# Patient Record
Sex: Male | Born: 1985 | Race: Black or African American | Hispanic: No | Marital: Married | Smoking: Former smoker
Health system: Southern US, Community
[De-identification: ages and names within clinical notes are randomized; demographics above are authoritative.]

## PROBLEM LIST (undated history)

## (undated) ENCOUNTER — Emergency Department (HOSPITAL_BASED_OUTPATIENT_CLINIC_OR_DEPARTMENT_OTHER): Payer: BC Managed Care – PPO | Source: Home / Self Care

## (undated) DIAGNOSIS — R202 Paresthesia of skin: Secondary | ICD-10-CM

## (undated) DIAGNOSIS — R200 Anesthesia of skin: Secondary | ICD-10-CM

## (undated) DIAGNOSIS — I10 Essential (primary) hypertension: Secondary | ICD-10-CM

## (undated) DIAGNOSIS — R9431 Abnormal electrocardiogram [ECG] [EKG]: Secondary | ICD-10-CM

## (undated) DIAGNOSIS — R51 Headache: Secondary | ICD-10-CM

## (undated) DIAGNOSIS — B009 Herpesviral infection, unspecified: Secondary | ICD-10-CM

## (undated) HISTORY — DX: Abnormal electrocardiogram (ECG) (EKG): R94.31

## (undated) HISTORY — DX: Headache: R51

## (undated) HISTORY — DX: Paresthesia of skin: R20.2

## (undated) HISTORY — DX: Anesthesia of skin: R20.0

---

## 2014-04-12 ENCOUNTER — Encounter (HOSPITAL_BASED_OUTPATIENT_CLINIC_OR_DEPARTMENT_OTHER): Payer: Self-pay | Admitting: Emergency Medicine

## 2014-04-12 ENCOUNTER — Emergency Department (HOSPITAL_BASED_OUTPATIENT_CLINIC_OR_DEPARTMENT_OTHER)
Admission: EM | Admit: 2014-04-12 | Discharge: 2014-04-13 | Disposition: A | Discharge status: Home / Self Care | Payer: BC Managed Care – PPO | Attending: Emergency Medicine | Admitting: Emergency Medicine

## 2014-04-12 DIAGNOSIS — B353 Tinea pedis: Secondary | ICD-10-CM

## 2014-04-12 DIAGNOSIS — Z87891 Personal history of nicotine dependence: Secondary | ICD-10-CM | POA: Insufficient documentation

## 2014-04-12 DIAGNOSIS — L03115 Cellulitis of right lower limb: Secondary | ICD-10-CM

## 2014-04-12 DIAGNOSIS — L02619 Cutaneous abscess of unspecified foot: Secondary | ICD-10-CM | POA: Insufficient documentation

## 2014-04-12 DIAGNOSIS — L03119 Cellulitis of unspecified part of limb: Principal | ICD-10-CM

## 2014-04-12 NOTE — ED Notes (Signed)
Right foot pain and some swelling around 5th toe.  Skin broken between toes.

## 2014-04-12 NOTE — ED Notes (Signed)
Rt foot pain w broken draining area between 4th toe and little toe onset yesterday   Denies inj

## 2014-04-13 MED ORDER — CEPHALEXIN 250 MG PO CAPS
1000.0000 mg | ORAL_CAPSULE | Freq: Once | ORAL | Status: AC
  Administered 2014-04-13: 1000 mg via ORAL
  Filled 2014-04-13: qty 4

## 2014-04-13 MED ORDER — CEPHALEXIN 500 MG PO CAPS: 500.0000 mg | ORAL_CAPSULE | Freq: Four times a day (QID) | ORAL | Status: DC

## 2014-04-13 MED ORDER — TERBINAFINE HCL 1 % EX CREA: 1.0000 "application " | TOPICAL_CREAM | Freq: Two times a day (BID) | CUTANEOUS | Status: DC

## 2014-04-13 NOTE — Discharge Instructions (Signed)
Athlete's Foot Athlete's foot (tinea pedis) is a fungal infection of the skin on the feet. It often occurs on the skin between the toes or underneath the toes. It can also occur on the soles of the feet. Athlete's foot is more likely to occur in hot, humid weather. Not washing your feet or changing your socks often enough can contribute to athlete's foot. The infection can spread from person to person (contagious). CAUSES Athlete's foot is caused by a fungus. This fungus thrives in warm, moist places. Most people get athlete's foot by sharing shower stalls, towels, and wet floors with an infected person. People with weakened immune systems, including those with diabetes, may be more likely to get athlete's foot. SYMPTOMS   Itchy areas between the toes or on the soles of the feet.  White, flaky, or scaly areas between the toes or on the soles of the feet.  Tiny, intensely itchy blisters between the toes or on the soles of the feet.  Tiny cuts on the skin. These cuts can develop a bacterial infection.  Thick or discolored toenails. DIAGNOSIS  Your caregiver can usually tell what the problem is by doing a physical exam. Your caregiver may also take a skin sample from the rash area. The skin sample may be examined under a microscope, or it may be tested to see if fungus will grow in the sample. A sample may also be taken from your toenail for testing. TREATMENT  Over-the-counter and prescription medicines can be used to kill the fungus. These medicines are available as powders or creams. Your caregiver can suggest medicines for you. Fungal infections respond slowly to treatment. You may need to continue using your medicine for several weeks. PREVENTION   Do not share towels.  Wear sandals in wet areas, such as shared locker rooms and shared showers.  Keep your feet dry. Wear shoes that allow air to circulate. Wear cotton or wool socks. HOME CARE INSTRUCTIONS   Take medicines as directed by  your caregiver. Do not use steroid creams on athlete's foot.  Keep your feet clean and cool. Wash your feet daily and dry them thoroughly, especially between your toes.  Change your socks every day. Wear cotton or wool socks. In hot climates, you may need to change your socks 2 to 3 times per day.  Wear sandals or canvas tennis shoes with good air circulation.  If you have blisters, soak your feet in Burow's solution or Epsom salts for 20 to 30 minutes, 2 times a day to dry out the blisters. Make sure you dry your feet thoroughly afterward. SEEK MEDICAL CARE IF:   You have a fever.  You have swelling, soreness, warmth, or redness in your foot.  You are not getting better after 7 days of treatment.  You are not completely cured after 30 days.  You have any problems caused by your medicines. MAKE SURE YOU:   Understand these instructions.  Will watch your condition.  Will get help right away if you are not doing well or get worse. Document Released: 11/12/2000 Document Revised: 02/07/2012 Document Reviewed: 09/03/2011 [employer] Patient Information 2014 [employer].  

## 2014-04-13 NOTE — ED Provider Notes (Addendum)
CSN: [id number]     Arrival date & time 04/12/14  2226 History   First MD Initiated Contact with Patient 04/13/14 0045     Chief Complaint  Patient presents with  . Foot Swelling     (Consider location/radiation/quality/duration/timing/severity/associated sxs/prior Treatment) HPI This is a 28 year old male with a two-day history of a blister in the space between his right fourth and fifth toes broke down. The space between the 2 toes is now tender and draining. He also has pain and mild erythema over the dorsal aspect of the right foot proximal to the toes. There is moderate pain on palpation or movement. He denies any systemic symptoms such as fever or chills. He has not been placing any medications on those toes.  History reviewed. No pertinent past medical history. History reviewed. No pertinent past surgical history. No family history on file. History  Substance Use Topics  . Smoking status: Former Games developermoker  . Smokeless tobacco: Not on file  . Alcohol Use: No    Review of Systems  All other systems reviewed and are negative.  Allergies  Review of patient's allergies indicates no known allergies.  Home Medications   Prior to Admission medications   Not on File   BP 147/90  Pulse 79  Temp(Src) 98.6 F (37 C) (Oral)  Resp 16  Ht 5\' 11"  (1.803 m)  Wt 270 lb (122.471 kg)  BMI 37.67 kg/m2  SpO2 97%  Physical Exam General: Well-developed, well-nourished male in no acute distress; appearance consistent with age of record HENT: normocephalic; atraumatic Eyes: pupils equal, round and reactive to light; extraocular muscles intact Neck: supple Heart: regular rate and rhythm Lungs: clear to auscultation bilaterally Abdomen: soft; nondistended; nontender; no masses or hepatosplenomegaly; bowel sounds present Extremities: No deformity; full range of motion; pulses normal; maceration and weeping of the skin between the right fourth and fifth toes with tenderness and mild  erythema of the dorsum of the right foot proximally Neurologic: Awake, alert and oriented; motor function intact in all extremities and symmetric; no facial droop Skin: Warm and dry; thickened toenails consistent with tinea unguium Psychiatric: Normal mood and affect    ED Course  Procedures (including critical care time)   MDM  Suspect tinea pedis with secondary bacterial infection.    Hanley SeamenJohn L Gwendolin Briel, MD 04/13/14 [id number]  Hanley SeamenJohn L Majesty Oehlert, MD 04/13/14 [phone]

## 2016-05-20 ENCOUNTER — Other Ambulatory Visit: Payer: Self-pay

## 2016-05-20 ENCOUNTER — Emergency Department (HOSPITAL_BASED_OUTPATIENT_CLINIC_OR_DEPARTMENT_OTHER)
Admission: EM | Admit: 2016-05-20 | Discharge: 2016-05-21 | Disposition: A | Discharge status: Home / Self Care | Payer: BLUE CROSS/BLUE SHIELD | Attending: Emergency Medicine | Admitting: Emergency Medicine

## 2016-05-20 ENCOUNTER — Encounter (HOSPITAL_BASED_OUTPATIENT_CLINIC_OR_DEPARTMENT_OTHER): Payer: Self-pay

## 2016-05-20 DIAGNOSIS — R202 Paresthesia of skin: Secondary | ICD-10-CM | POA: Insufficient documentation

## 2016-05-20 DIAGNOSIS — R200 Anesthesia of skin: Secondary | ICD-10-CM | POA: Diagnosis not present

## 2016-05-20 DIAGNOSIS — Z87891 Personal history of nicotine dependence: Secondary | ICD-10-CM | POA: Insufficient documentation

## 2016-05-20 DIAGNOSIS — R51 Headache: Secondary | ICD-10-CM | POA: Diagnosis present

## 2016-05-20 DIAGNOSIS — R9431 Abnormal electrocardiogram [ECG] [EKG]: Secondary | ICD-10-CM | POA: Insufficient documentation

## 2016-05-20 DIAGNOSIS — R519 Headache, unspecified: Secondary | ICD-10-CM

## 2016-05-20 LAB — CBC WITH DIFFERENTIAL/PLATELET
Basophils Absolute: 0 10*3/uL (ref 0.0–0.1)
Basophils Relative: 0 %
Eosinophils Absolute: 0.3 10*3/uL (ref 0.0–0.7)
Eosinophils Relative: 4 %
HEMATOCRIT: 42.3 % (ref 39.0–52.0)
Hemoglobin: 14.6 g/dL (ref 13.0–17.0)
LYMPHS PCT: 39 %
Lymphs Abs: 2.8 10*3/uL (ref 0.7–4.0)
MCH: 29 pg (ref 26.0–34.0)
MCHC: 34.5 g/dL (ref 30.0–36.0)
MCV: 83.9 fL (ref 78.0–100.0)
MONOS PCT: 8 %
Monocytes Absolute: 0.6 10*3/uL (ref 0.1–1.0)
NEUTROS ABS: 3.5 10*3/uL (ref 1.7–7.7)
Neutrophils Relative %: 49 %
Platelets: 311 10*3/uL (ref 150–400)
RBC: 5.04 MIL/uL (ref 4.22–5.81)
RDW: 12.6 % (ref 11.5–15.5)
WBC: 7.2 10*3/uL (ref 4.0–10.5)

## 2016-05-20 LAB — BASIC METABOLIC PANEL
ANION GAP: 10 (ref 5–15)
BUN: 12 mg/dL (ref 6–20)
CALCIUM: 9.5 mg/dL (ref 8.9–10.3)
CO2: 28 mmol/L (ref 22–32)
Chloride: 100 mmol/L — ABNORMAL LOW (ref 101–111)
Creatinine, Ser: 1.04 mg/dL (ref 0.61–1.24)
GFR calc Af Amer: >60 mL/min (ref >60)
GFR calc non Af Amer: >60 mL/min (ref >60)
GLUCOSE: 92 mg/dL (ref 65–99)
POTASSIUM: 3.7 mmol/L (ref 3.5–5.1)
Sodium: 138 mmol/L (ref 135–145)

## 2016-05-20 NOTE — ED Notes (Signed)
Pt wants to be "checked out."  He states he has had a headache for the last several days and that his blood pressure has been in the 150's.  Pt has not scheduled an appointment with a PCP.  He would like to know if he has high blood pressure.

## 2016-05-20 NOTE — ED Notes (Signed)
As nurse was leaving room, pt stated that he was having left arm numbness while at work earlier [employer].  No numbness or tingling now.

## 2016-05-20 NOTE — ED Provider Notes (Signed)
CSN: [id number]     Arrival date & time 05/20/16  2251 History  By signing my name below, I, Jasmyn B. Alexander, attest that this documentation has been prepared under the direction and in the presence of Paula LibraJohn Ibrahim Mcpheeters, MD.  Electronically Signed: Gillis EndsJasmyn B. Lyn HollingsheadAlexander, ED Scribe. 05/20/2016. 11:09 PM.   Chief Complaint  Patient presents with  . Headache    The history is provided by the patient. No language interpreter was used.   HPI Comments: Nathaniel Wilkerson is a 30 y.o. male who presents to the Emergency Department complaining of gradual onset, constant, 8/10, frontal and right occipital headache x 3 days. Pt also reports having numbness of the left deltoid region that began about 5PM. He reports that his blood pressure has been in the 150s systolic when tested at work early this week. It was noted to be 138/97 on arrival here, 145/82 on recheck. He has had associated intermittent heart palpitations (rapid heartbeat) that last a couple minutes and then resolve. He has taken 2 aspirin with partial relief of headache. Denies any pain, SOB or focal weakness.  History reviewed. No pertinent past medical history. History reviewed. No pertinent past surgical history. No family history on file. Social History  Substance Use Topics  . Smoking status: Former Games developermoker  . Smokeless tobacco: None  . Alcohol Use: No    Review of Systems  All other systems reviewed and are negative.   Allergies  Review of patient's allergies indicates no known allergies.  Home Medications   Prior to Admission medications   Medication Sig Start Date End Date Taking? Authorizing Provider  cephALEXin (KEFLEX) 500 MG capsule Take 1 capsule (500 mg total) by mouth 4 (four) times daily. 04/13/14   Iza Preston, MD  terbinafine (LAMISIL AT) 1 % cream Apply 1 application topically 2 (two) times daily. 04/13/14   Raife Lizer, MD   BP 145/82 mmHg  Pulse 72  Temp(Src) 98.3 F (36.8 C) (Oral)  Resp 17  Ht 5\' 11"  (1.803 m)   Wt 267 lb (121.11 kg)  BMI 37.26 kg/m2  SpO2 99%   Physical Exam  General: Well-developed, well-nourished male in no acute distress; appearance consistent with age of record HENT: normocephalic; atraumatic Eyes: pupils equal, round and reactive to light; extraocular muscles intact Neck: supple; no change in paresthesias with range of motion Heart: regular rate and rhythm Lungs: clear to auscultation bilaterally Abdomen: soft; nondistended; nontender; no masses or hepatosplenomegaly; bowel sounds present Extremities: No deformity; full range of motion; pulses normal Neurologic: Awake, alert and oriented; motor function intact in all extremities and symmetric; no facial droop; sensation intact bilaterally with subjective alteration of the left deltoid region; normal coordination and speech; negative Romberg; normal finger to nose Skin: Warm and dry Psychiatric: Normal mood and affect   ED Course  Procedures (including critical care time)   MDM   Nursing notes and vitals signs, including pulse oximetry, reviewed.  Summary of this visit's results, reviewed by myself:   EKG Interpretation  Date/Time:  Thursday May 20 2016 23:14:59 EDT Ventricular Rate:  69 PR Interval:    QRS Duration: 101 QT Interval:  383 QTC Calculation: 411 R Axis:   -93 Text Interpretation:  Sinus rhythm Borderline prolonged PR interval Consider left atrial enlargement Left anterior fascicular block Borderline T abnormalities, inferior leads No previous ECGs available Confirmed by Read DriversMOLPUS  MD, Jonny RuizJOHN ([id number]) on 05/20/2016 11:25:38 PM       Labs:  Results for orders placed or performed  during the hospital encounter of 05/20/16 (from the past 24 hour(s))  Basic metabolic panel     Status: Abnormal   Collection Time: 05/20/16 11:15 PM  Result Value Ref Range   Sodium 138 135 - 145 mmol/L   Potassium 3.7 3.5 - 5.1 mmol/L   Chloride 100 (L) 101 - 111 mmol/L   CO2 28 22 - 32 mmol/L   Glucose, Bld 92 65 -  99 mg/dL   BUN 12 6 - 20 mg/dL   Creatinine, Ser 1.611.04 0.61 - 1.24 mg/dL   Calcium 9.5 8.9 - 09.610.3 mg/dL   GFR calc non Af Amer >60 >60 mL/min   GFR calc Af Amer >60 >60 mL/min   Anion gap 10 5 - 15  CBC with Differential/Platelet     Status: None   Collection Time: 05/20/16 11:15 PM  Result Value Ref Range   WBC 7.2 4.0 - 10.5 K/uL   RBC 5.04 4.22 - 5.81 MIL/uL   Hemoglobin 14.6 13.0 - 17.0 g/dL   HCT 04.542.3 40.939.0 - 81.152.0 %   MCV 83.9 78.0 - 100.0 fL   MCH 29.0 26.0 - 34.0 pg   MCHC 34.5 30.0 - 36.0 g/dL   RDW 91.412.6 78.211.5 - 95.615.5 %   Platelets 311 150 - 400 K/uL   Neutrophils Relative % 49 %   Neutro Abs 3.5 1.7 - 7.7 K/uL   Lymphocytes Relative 39 %   Lymphs Abs 2.8 0.7 - 4.0 K/uL   Monocytes Relative 8 %   Monocytes Absolute 0.6 0.1 - 1.0 K/uL   Eosinophils Relative 4 %   Eosinophils Absolute 0.3 0.0 - 0.7 K/uL   Basophils Relative 0 %   Basophils Absolute 0.0 0.0 - 0.1 K/uL   12:00 AM Patient advised of EKG findings and normal laboratory studies. His symptoms are not consistent with an acute CVA. Given his abnormal EKG and borderline hypertension we will have him follow-up with cardiology.  Final diagnoses:  Bad headache  Nonspecific abnormal electrocardiogram (ECG) (EKG)  Numbness and tingling in left arm   I personally performed the services described in this documentation, which was scribed in my presence. The recorded information has been reviewed and is accurate.   Paula LibraJohn Kaelyn Innocent, MD 05/21/16 0003

## 2016-05-21 MED ORDER — NAPROXEN 250 MG PO TABS
500.0000 mg | ORAL_TABLET | Freq: Once | ORAL | Status: AC
  Administered 2016-05-21: 500 mg via ORAL
  Filled 2016-05-21: qty 2

## 2016-06-30 ENCOUNTER — Ambulatory Visit (INDEPENDENT_AMBULATORY_CARE_PROVIDER_SITE_OTHER): Payer: BLUE CROSS/BLUE SHIELD | Admitting: Cardiology

## 2016-06-30 VITALS — BP 146/96 | HR 77 | Ht 71.0 in | Wt 290.8 lb

## 2016-06-30 DIAGNOSIS — G4710 Hypersomnia, unspecified: Secondary | ICD-10-CM | POA: Diagnosis not present

## 2016-06-30 DIAGNOSIS — R002 Palpitations: Secondary | ICD-10-CM

## 2016-06-30 DIAGNOSIS — R519 Headache, unspecified: Secondary | ICD-10-CM | POA: Insufficient documentation

## 2016-06-30 DIAGNOSIS — R9431 Abnormal electrocardiogram [ECG] [EKG]: Secondary | ICD-10-CM

## 2016-06-30 DIAGNOSIS — R0683 Snoring: Secondary | ICD-10-CM | POA: Diagnosis not present

## 2016-06-30 DIAGNOSIS — R202 Paresthesia of skin: Secondary | ICD-10-CM

## 2016-06-30 DIAGNOSIS — R200 Anesthesia of skin: Secondary | ICD-10-CM

## 2016-06-30 DIAGNOSIS — R51 Headache: Secondary | ICD-10-CM

## 2016-06-30 DIAGNOSIS — R400 Somnolence: Secondary | ICD-10-CM

## 2016-06-30 HISTORY — DX: Abnormal electrocardiogram (ECG) (EKG): R94.31

## 2016-06-30 HISTORY — DX: Headache, unspecified: R51.9

## 2016-06-30 HISTORY — DX: Paresthesia of skin: R20.0

## 2016-06-30 NOTE — Progress Notes (Signed)
Electrophysiology Office Note   Date:  06/30/2016   ID:  Kathryn Hubanks, DOB Jul 26, 1986, MRN [id number]  PCP:  No PCP Per Patient  Cardiologist:  Demar Shad Jorja Loa, MD    Chief Complaint  Patient presents with  . New Patient (Initial Visit)    abnormal EKG     History of Present Illness: Nathaniel Wilkerson is a 30 y.o. male who presents today for electrophysiology evaluation.   He presented to the emergency room on 05/20/16 with worsening headaches. He also noted intermittent palpitations for a couple minutes which have resolved on their own. He was noted to have borderline hypertension and abnormal EKG with significant left axis deviation. Since his ER visit, he has not had any further palpitations. He says that he has changed his diet was feels like has helped. He does say that he snores at night, and people told him that he does stop breathing when he sleeps.   Today, he denies symptoms of palpitations, chest pain, shortness of breath, orthopnea, PND, lower extremity edema, claudication, dizziness, presyncope, syncope, bleeding, or neurologic sequela. The patient is tolerating medications without difficulties and is otherwise without complaint today.    Past Medical History:  Diagnosis Date  . Abnormal EKG 06/30/2016  . Numbness and tingling in left arm 06/30/2016  . Occipital headache 06/30/2016   History reviewed. No pertinent surgical history.   Current Outpatient Prescriptions  Medication Sig Dispense Refill  . acetaminophen (TYLENOL) 325 MG tablet Take 650 mg by mouth every 6 (six) hours as needed (pain).     No current facility-administered medications for this visit.     Allergies:   Review of patient's allergies indicates no known allergies.   Social History:  The patient  reports that he has quit smoking. He has never used smokeless tobacco. He reports that he does not drink alcohol or use drugs.   Family History:  The patient's family history includes Diabetes in his  maternal grandmother and paternal grandfather; Heart attack in his maternal grandfather and paternal grandfather; Hypertension in his father, maternal grandfather, maternal grandmother, mother, and paternal grandmother; Kidney cancer in his paternal grandfather.    ROS:  Please see the history of present illness.   Otherwise, review of systems is positive for snoring, cough.   All other systems are reviewed and negative.    PHYSICAL EXAM: VS:  BP (!) 146/96   Pulse 77   Ht 5\' 11"  (1.803 m)   Wt 290 lb 12.8 oz (131.9 kg)   BMI 40.56 kg/m  , BMI Body mass index is 40.56 kg/m. GEN: Well nourished, well developed, in no acute distress  HEENT: normal  Neck: no JVD, carotid bruits, or masses Cardiac: RRR; no murmurs, rubs, or gallops,no edema  Respiratory:  clear to auscultation bilaterally, normal work of breathing GI: soft, nontender, nondistended, + BS MS: no deformity or atrophy  Skin: warm and dry Neuro:  Strength and sensation are intact Psych: euthymic mood, full affect  EKG:  EKG is ordered today. Personal review of the ekg ordered shows sinus rhythm, rate 77, LAFB, R axis -78, nonspecific TWI  Recent Labs: 05/20/2016: BUN 12; Creatinine, Ser 1.04; Hemoglobin 14.6; Platelets 311; Potassium 3.7; Sodium 138    Lipid Panel  No results found for: CHOL, TRIG, HDL, CHOLHDL, VLDL, LDLCALC, LDLDIRECT   Wt Readings from Last 3 Encounters:  06/30/16 290 lb 12.8 oz (131.9 kg)  05/20/16 267 lb (121.1 kg)  04/12/14 270 lb (122.5 kg)  Other studies Reviewed: Additional studies/ records that were reviewed today include: ER notes   ASSESSMENT AND PLAN:  1.  Palpitations: Have not occurred since he changed his diet. We'll continue to monitor.  2. Abnormal ECG: Shows significant left axis deviation with possible LVH. We'll get a transthoracic echo to further evaluate.  3. Hypertension: Blood pressures have been elevated over the last few checks. I have told him that in the  future, it is likely that we Shauntavia Brackin have to treat his elevated blood pressures. He Jaice Lague check his blood pressures at home to see if they are elevated there.  4. Snoring: Does report episodes of apnea. We'll get a sleep study to further evaluate.    Current medicines are reviewed at length with the patient today.   The patient does not have concerns regarding his medicines.  The following changes were made today:  none  Labs/ tests ordered today include:  Orders Placed This Encounter  Procedures  . EKG 12-Lead  . ECHOCARDIOGRAM COMPLETE  . Split night study     Disposition:   FU with Orhan Mayorga 3 months  Signed, Tandi Hanko Jorja Loa, MD  06/30/2016 3:38 PM     [facility] [address] [place] [address] [phone] (office) [phone] (fax)

## 2016-06-30 NOTE — Patient Instructions (Addendum)
Medication Instructions:    Your physician recommends that you continue on your current medications as directed. Please refer to the Current Medication list given to you today.  --- If you need a refill on your cardiac medications before your next appointment, please call your pharmacy. ---  Labwork:  None ordered  Testing/Procedures: Your physician has requested that you have an echocardiogram. Echocardiography is a painless test that uses sound waves to create images of your heart. It provides your doctor with information about the size and shape of your heart and how well your heart's chambers and valves are working. This procedure takes approximately one hour. There are no restrictions for this procedure.  You are scheduled for July 07, 2016 @ 11:30 a.m.  Your physician has recommended that you have a sleep study. This test records several body functions during sleep, including: brain activity, eye movement, oxygen and carbon dioxide blood levels, heart rate and rhythm, breathing rate and rhythm, the flow of air through your mouth and nose, snoring, body muscle movements, and chest and belly movement.  Follow-Up:  Your physician recommends that you schedule a follow-up appointment in: 3 months with Dr. Elberta Fortis  Thank you for choosing [facility]!!   Dory Horn, RN [phone]  Any Other Special Instructions Will Be Listed Below (If Applicable).  Echocardiogram An echocardiogram, or echocardiography, uses sound waves (ultrasound) to produce an image of your heart. The echocardiogram is simple, painless, obtained within a short period of time, and offers valuable information to your health care provider. The images from an echocardiogram can provide information such as:  Evidence of coronary artery disease (CAD).  Heart size.  Heart muscle function.  Heart valve function.  Aneurysm detection.  Evidence of a past heart attack.  Fluid buildup around the  heart.  Heart muscle thickening.  Assess heart valve function. LET [facility] CARE PROVIDER KNOW ABOUT:  Any allergies you have.  All medicines you are taking, including vitamins, herbs, eye drops, creams, and over-the-counter medicines.  Previous problems you or members of your family have had with the use of anesthetics.  Any blood disorders you have.  Previous surgeries you have had.  Medical conditions you have.  Possibility of pregnancy, if this applies. BEFORE THE PROCEDURE  No special preparation is needed. Eat and drink normally.  PROCEDURE   In order to produce an image of your heart, gel will be applied to your chest and a wand-like tool (transducer) will be moved over your chest. The gel will help transmit the sound waves from the transducer. The sound waves will harmlessly bounce off your heart to allow the heart images to be captured in real-time motion. These images will then be recorded.  You may need an IV to receive a medicine that improves the quality of the pictures. AFTER THE PROCEDURE You may return to your normal schedule including diet, activities, and medicines, unless your health care provider tells you otherwise.   This information is not intended to replace advice given to you by your health care provider. Make sure you discuss any questions you have with your health care provider.   Document Released: 11/12/2000 Document Revised: 12/06/2014 Document Reviewed: 07/23/2013 Elsevier Interactive Patient Education 2016 [employer]   Sleep Studies A sleep study (polysomnogram) is a series of tests done while you are sleeping. It can show how well you sleep. This can help your health care provider diagnose a sleep disorder and show how severe your sleep disorder is. A  sleep study may lead to treatment that will help you sleep better and prevent other medical problems caused by poor sleep. If you have a sleep disorder, you may also be at risk for:    Sleep-related accidents.  High blood pressure.  Heart disease.  Stroke.  Other medical conditions. Sleep disorders are common. Your health care provider may suspect a sleep disorder if you:  Have loud snoring most nights.  Have brief periods when you stop breathing at night.  Feel sleepy on most days.  Fall asleep suddenly during the day.  Have trouble falling asleep or staying asleep.  Feel like you need to move your legs when trying to fall asleep.  Have dreams that seem very real shortly after falling asleep.  Feel like you cannot move when you first wake up. WHICH TESTS WILL I NEED TO HAVE?  Most sleep studies last all night and include these tests:  Recordings of your brain activity.  Recordings of your eye movements.  Recording of your heart rate and rhythm.  Blood pressure readings.  Readings of the amount of oxygen in your blood.  Measurements of your chest and belly movement as you breathe during sleep. If you have signs of the sleep disorder called sleep apnea during your test, you may get a mask to wear for the second half of the night.   The mask provides continuous positive airway pressure (CPAP). This may improve sleep apnea significantly.  You will then have all tests done again with the mask in place to see if your measurements and recordings change. HOW ARE SLEEP STUDIES DONE? Most sleep studies are done over one full night of sleep.   You will arrive at the study center in the evening and can go home in the morning.  Bring your pajamas and toothbrush.  Do not have caffeine on the day of your sleep study.  Your health care provider will let you know if you need to stop taking any of your regular medicines before the test. To do the tests included in a polysomnogram, you will have:  Round, sticky patches with sensors attached to recording wires (electrodes) placed on your scalp, face, chest, and limbs.  Wires from all the electrodes and  sensors run from your bed to a computer. The wires can be taken off and put back on if you need to get out of bed to go to the bathroom.  A sensor placed over your nose to measure airflow.  A finger clip put on one finger to measure your blood oxygen level.  A belt around your belly and a belt around your chest to measure breathing movements. WHERE ARE SLEEP STUDIES DONE?  Sleep studies are done at sleep centers. A sleep center may be inside a hospital, office, or clinic.  The room where you have the study may look like a hospital room or a hotel room. The health care providers doing the study may come in and out of the room during the study. Most of the time, they will be in another room monitoring your test.  HOW IS INFORMATION FROM SLEEP STUDIES HELPFUL? A polysomnogram can be used along with your medical history and a physical exam to diagnose conditions, such as:  Sleep apnea.  Restless legs syndrome.  Sleep-related seizure disorders.  Sleep-related movement disorders. A medical doctor who specializes in sleep will evaluate your sleep study. The specialist will share the results with your primary health care provider. Treatments based on your sleep  study may include:  Improving your sleep habits (sleep hygiene).  Wearing a CPAP mask.  Wearing an oral device at night to improve breathing and reduce snoring.  Taking medicine for:  Restless legs syndrome.  Sleep-related seizure disorder.  Sleep-related movement disorder.   This information is not intended to replace advice given to you by your health care provider. Make sure you discuss any questions you have with your health care provider.   Document Released: 05/22/2003 Document Revised: 12/06/2014 Document Reviewed: 01/21/2014 Elsevier Interactive Patient Education [employer]

## 2016-07-07 ENCOUNTER — Ambulatory Visit (HOSPITAL_BASED_OUTPATIENT_CLINIC_OR_DEPARTMENT_OTHER): Admission: RE | Admit: 2016-07-07 | Payer: BLUE CROSS/BLUE SHIELD | Source: Ambulatory Visit

## 2016-07-21 ENCOUNTER — Ambulatory Visit (HOSPITAL_BASED_OUTPATIENT_CLINIC_OR_DEPARTMENT_OTHER)
Admission: RE | Admit: 2016-07-21 | Discharge: 2016-07-21 | Disposition: A | Discharge status: Home / Self Care | Payer: BLUE CROSS/BLUE SHIELD | Source: Ambulatory Visit | Attending: Cardiology | Admitting: Cardiology

## 2016-07-21 DIAGNOSIS — R002 Palpitations: Secondary | ICD-10-CM | POA: Diagnosis present

## 2016-07-21 NOTE — Progress Notes (Signed)
Echocardiogram 2D Echocardiogram has been performed.  Dorothey BasemanReel, Ludia Gartland M 07/21/2016, 2:56 PM

## 2016-07-22 ENCOUNTER — Encounter: Payer: Self-pay | Admitting: Cardiology

## 2016-07-22 NOTE — Telephone Encounter (Signed)
New message ° ° °Pt returning nurse call about test results.  °

## 2016-07-22 NOTE — Telephone Encounter (Signed)
Returned patient call-no answer, LMTCB.

## 2016-07-22 NOTE — Telephone Encounter (Signed)
This encounter was created in error - please disregard.

## 2016-07-28 ENCOUNTER — Ambulatory Visit: Payer: BLUE CROSS/BLUE SHIELD | Admitting: Cardiology

## 2016-08-29 ENCOUNTER — Ambulatory Visit (HOSPITAL_BASED_OUTPATIENT_CLINIC_OR_DEPARTMENT_OTHER): Payer: BLUE CROSS/BLUE SHIELD | Attending: Cardiology | Admitting: Cardiology

## 2016-08-29 VITALS — Ht 72.0 in | Wt 290.0 lb

## 2016-08-29 DIAGNOSIS — G4733 Obstructive sleep apnea (adult) (pediatric): Secondary | ICD-10-CM | POA: Diagnosis not present

## 2016-08-29 DIAGNOSIS — R400 Somnolence: Secondary | ICD-10-CM | POA: Diagnosis present

## 2016-08-29 DIAGNOSIS — R0683 Snoring: Secondary | ICD-10-CM | POA: Insufficient documentation

## 2016-09-06 NOTE — Procedures (Signed)
   Patient Name: Nathaniel Wilkerson, Nathaniel Wilkerson Study Date: 08/29/2016 Gender: Male D.O.B: 01-05-1986 Age (years): 30 Referring Provider: Olga MillersBrian Crenshaw Height (inches): 72 Interpreting Physician: Armanda Magicraci Lesleigh Hughson MD, ABSM Weight (lbs): 290 RPSGT: Armen PickupFord, Evelyn BMI: 39 MRN: [id number] Neck Size: 18.00  CLINICAL INFORMATION Sleep Study Type: NPSG Indication for sleep study: Snoring  SLEEP STUDY TECHNIQUE As per the AASM Manual for the Scoring of Sleep and Associated Events v2.3 (April 2016) with a hypopnea requiring 4% desaturations. The channels recorded and monitored were frontal, central and occipital EEG, electrooculogram (EOG), submentalis EMG (chin), nasal and oral airflow, thoracic and abdominal wall motion, anterior tibialis EMG, snore microphone, electrocardiogram, and pulse oximetry.  MEDICATIONS Patient's medications include: reviewed in the chart. Medications self-administered by patient during sleep study : No sleep medicine administered.  SLEEP ARCHITECTURE The study was initiated at 9:58:31 PM and ended at 3:54:13 AM. Sleep onset time was 14.7 minutes and the sleep efficiency was 27.0%. The total sleep time was 96.0 minutes. Stage REM latency was N/A minutes. The patient spent 5.73% of the night in stage N1 sleep, 86.98% in stage N2 sleep, 7.29% in stage N3 and 0.00% in REM. Alpha intrusion was absent. Supine sleep was 100.00%.  RESPIRATORY PARAMETERS The overall apnea/hypopnea index (AHI) was 18.1 per hour. There were 2 total apneas, including 2 obstructive, 0 central and 0 mixed apneas. There were 27 hypopneas and 44 RERAs. The AHI during Stage REM sleep was N/A per hour. AHI while supine was 18.1 per hour. The mean oxygen saturation was 93.52%. The minimum SpO2 during sleep was 89.00%. Loud snoring was noted during this study.  CARDIAC DATA The 2 lead EKG demonstrated sinus rhythm. The mean heart rate was 76.62 beats per minute. Other EKG findings include: None.  LEG  MOVEMENT DATA The total PLMS were 0 with a resulting PLMS index of 0.00. Associated arousal with leg movement index was 0.0 .  IMPRESSIONS - Moderate obstructive sleep apnea occurred during this study (AHI = 18.1/h). - No significant central sleep apnea occurred during this study (CAI = 0.0/h). - The patient had minimal or no oxygen desaturation during the study (Min O2 = 89.00%) - The patient snored with Loud snoring volume. - No cardiac abnormalities were noted during this study. - Clinically significant periodic limb movements did not occur during sleep. No significant associated arousals.  DIAGNOSIS - Obstructive Sleep Apnea (327.23 [G47.33 ICD-10])  RECOMMENDATIONS - Recommend CPAP titration with a sleep aide as patient had a difficult time sleeping during the study. - Positional therapy avoiding supine position during sleep. - Avoid alcohol, sedatives and other CNS depressants that may worsen sleep apnea and disrupt normal sleep architecture. - Sleep hygiene should be reviewed to assess factors that may improve sleep quality. - Weight management and regular exercise should be initiated or continued if appropriate.   Armanda Magicraci Jacquelina Hewins Diplomate, American Board of Sleep Medicine  ELECTRONICALLY SIGNED ON:  09/06/2016, 9:01 PM [facility] PH: [phone]   FX: [phone] ACCREDITED BY THE AMERICAN ACADEMY OF SLEEP MEDICINE

## 2016-09-07 NOTE — Progress Notes (Signed)
Left message for patient to call back for results.  

## 2016-09-09 NOTE — Progress Notes (Signed)
Spoke with patient at length about sleep apnea and him being in the moderate category.   I explained that the treatment for his apnea would be a CPAP machine.   I also discussed the sleep aide that Dr. Mayford Knifeurner had given approval to call in for his titration study. At this time patient states that he is not sure he wants to proceed.   He is aware of why we feel like this is necessary, and I have explained the next step of titration to him.  Patient stated that he would like to think about this and call me back with a decision.   Patient is aware that he can call my direct line and let me know what he would like to do.   Routed to ordering physician just as an [place].

## 2016-11-26 ENCOUNTER — Emergency Department (HOSPITAL_BASED_OUTPATIENT_CLINIC_OR_DEPARTMENT_OTHER)
Admission: EM | Admit: 2016-11-26 | Discharge: 2016-11-26 | Disposition: A | Discharge status: Home / Self Care | Payer: BLUE CROSS/BLUE SHIELD | Attending: Emergency Medicine | Admitting: Emergency Medicine

## 2016-11-26 ENCOUNTER — Encounter (HOSPITAL_BASED_OUTPATIENT_CLINIC_OR_DEPARTMENT_OTHER): Payer: Self-pay | Admitting: *Deleted

## 2016-11-26 DIAGNOSIS — N509 Disorder of male genital organs, unspecified: Secondary | ICD-10-CM | POA: Diagnosis not present

## 2016-11-26 DIAGNOSIS — Z87891 Personal history of nicotine dependence: Secondary | ICD-10-CM | POA: Diagnosis not present

## 2016-11-26 DIAGNOSIS — Z202 Contact with and (suspected) exposure to infections with a predominantly sexual mode of transmission: Secondary | ICD-10-CM | POA: Diagnosis present

## 2016-11-26 DIAGNOSIS — N489 Disorder of penis, unspecified: Secondary | ICD-10-CM

## 2016-11-26 LAB — URINALYSIS, ROUTINE W REFLEX MICROSCOPIC
Bilirubin Urine: NEGATIVE
Glucose, UA: NEGATIVE mg/dL
Hgb urine dipstick: NEGATIVE
Ketones, ur: NEGATIVE mg/dL
LEUKOCYTES UA: NEGATIVE
Nitrite: NEGATIVE
PROTEIN: NEGATIVE mg/dL
SPECIFIC GRAVITY, URINE: 1.016 (ref 1.005–1.030)
pH: 7.5 (ref 5.0–8.0)

## 2016-11-26 MED ORDER — VALACYCLOVIR HCL 1 G PO TABS: 1000.0000 mg | ORAL_TABLET | Freq: Two times a day (BID) | ORAL | 0 refills | Status: AC

## 2016-11-26 NOTE — ED Provider Notes (Signed)
[facility] Note   CSN: [id number] Arrival date & time: 11/26/16  1802   By signing my name below, I, Clovis PuAvnee Patel, attest that this documentation has been prepared under the direction and in the presence of  RaytheonJosh Solita Macadam PA-C. Electronically Signed: Clovis PuAvnee Patel, ED Scribe. 11/26/16. 8:15 PM.   History   Chief Complaint Chief Complaint  Patient presents with  . Exposure to STD   The history is provided by the patient. No language interpreter was used.   HPI Comments:  Nathaniel Wilkerson is a 30 y.o. male who presents to the Emergency Department complaining of gradual onset, persistent groin itching x several days. Pt also reports dysuria and sores to his groin. He notes he had unprotected intercourse with his partner of 5 years. He states he is unaware if his partner is affected. No alleviating factors noted. Pt denies sore throat, fevers, any other associated symptoms and any other modifying factors at this time.    Past Medical History:  Diagnosis Date  . Abnormal EKG 06/30/2016  . Numbness and tingling in left arm 06/30/2016  . Occipital headache 06/30/2016    Patient Active Problem List   Diagnosis Date Noted  . Occipital headache 06/30/2016  . Numbness and tingling in left arm 06/30/2016  . Abnormal EKG 06/30/2016    History reviewed. No pertinent surgical history.  Home Medications    Prior to Admission medications   Medication Sig Start Date End Date Taking? Authorizing Provider  acetaminophen (TYLENOL) 325 MG tablet Take 650 mg by mouth every 6 (six) hours as needed (pain).    Historical Provider, MD    Family History Family History  Problem Relation Age of Onset  . Hypertension Mother   . Hypertension Father   . Heart attack Maternal Grandfather   . Hypertension Maternal Grandfather   . Heart attack Paternal Grandfather   . Kidney cancer Paternal Grandfather   . Diabetes Paternal Grandfather   . Hypertension Maternal Grandmother   . Diabetes  Maternal Grandmother   . Hypertension Paternal Grandmother     Social History Social History  Substance Use Topics  . Smoking status: Former Games developermoker  . Smokeless tobacco: Never Used  . Alcohol use No     Allergies   Patient has no known allergies.   Review of Systems Review of Systems  Constitutional: Negative for fever.  HENT: Negative for sore throat.   Eyes: Negative for discharge.  Gastrointestinal: Negative for rectal pain.  Genitourinary: Positive for dysuria and genital sores. Negative for discharge, frequency, penile pain and testicular pain.  Musculoskeletal: Negative for arthralgias.  Skin: Negative for rash.  Hematological: Negative for adenopathy.   Physical Exam Updated Vital Signs BP 156/94   Pulse 85   Temp 98.3 F (36.8 C) (Oral)   Resp 20   Ht 6' (1.829 m)   Wt 290 lb (131.5 kg)   SpO2 97%   BMI 39.33 kg/m   Physical Exam  Constitutional: He is oriented to person, place, and time. He appears well-developed and well-nourished. No distress.  HENT:  Head: Normocephalic and atraumatic.  Eyes: Conjunctivae are normal.  Neck: Normal range of motion. Neck supple.  Cardiovascular: Normal rate.   Pulmonary/Chest: Effort normal. No respiratory distress.  Abdominal: He exhibits no distension.  Genitourinary: Testes normal. Circumcised. Penile erythema present. No penile tenderness. No discharge found.     Lymphadenopathy: No inguinal adenopathy noted on the right or left side.  Neurological: He is alert and oriented to  person, place, and time.  Skin: Skin is warm and dry.  Psychiatric: He has a normal mood and affect.  Nursing note and vitals reviewed.  ED Treatments / Results  DIAGNOSTIC STUDIES:  Oxygen Saturation is 97% on RA, normal by my interpretation.    COORDINATION OF CARE:  8:14 PM Will treat with antibiotics. Discussed treatment plan with pt at bedside and pt agreed to plan.  Labs (all labs ordered are listed, but only abnormal  results are displayed) Labs Reviewed  URINALYSIS, ROUTINE W REFLEX MICROSCOPIC  RPR  HIV ANTIBODY (ROUTINE TESTING)  GC/CHLAMYDIA PROBE AMP ([place]    Procedures Procedures (including critical care time)   Initial Impression / Assessment and Plan / ED Course  I have reviewed the triage vital signs and the nursing notes.  Pertinent labs & imaging results that were available during my care of the patient were reviewed by me and considered in my medical decision making (see chart for details).  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 No h/o renal problems. Pt advised on safe sex practices and understands that they have GC/Chlamydia cultures pending and will result in 2-3 days. HIV and RPR sent. Pt encouraged to follow up at local health department for future STI checks. No concern for prostatitis or epididymitis. Discussed return precautions. Pt appears safe for discharge.    Final Clinical Impressions(s) / ED Diagnoses   Final diagnoses:  Penile lesion   Patient with penile lesion, possible genital herpes. Patient tested for gonorrhea, chlamydia, syphilis, and HIV. Will place on course of Valtrex empirically. Follow-up as above.  New Prescriptions Discharge Medication List as of 11/26/2016  8:59 PM    START taking these medications   Details  valACYclovir (VALTREX) 1000 MG tablet Take 1 tablet (1,000 mg total) by mouth 2 (two) times daily., Starting Fri 11/26/2016, Until Fri 12/10/2016, Print      I personally performed the services described in this documentation, which was scribed in my presence. The recorded information has been reviewed and is accurate.     Renne CriglerJoshua Audrinna Sherman, PA-C 11/26/16 2107      Laurence Spatesachel Morgan Little, MD 11/26/16 [phone]

## 2016-11-26 NOTE — ED Triage Notes (Addendum)
Std exposure a week ago. No symptoms.

## 2016-11-26 NOTE — Discharge Instructions (Signed)
Please read and follow all provided instructions.  Your diagnoses today include:  1. Penile lesion     Tests performed today include:  Test for gonorrhea and chlamydia.   Test for HIV and syphilis.   You will be notified by telephone with any positive results.   Vital signs. See below for your results today.   Medications:   Valtrex - medication to help control possible herpes infection  Home care instructions:  Read educational materials contained in this packet and follow any instructions provided.   You should tell your partners about your infection and avoid having sex for one week to allow time for the medicine to work.  Sexually transmitted disease testing also available at:   [facility] [place] [place]  [address], [place], phone [phone] or [phone]    Monday - Friday, call for an appointment  [facility] [place]  [address], [place], phone [phone] or [phone]   Monday - Friday, call for an appointment  Return instructions:   Please return to the Emergency Department if you experience worsening symptoms.   Please return if you have any other emergent concerns.  Additional Information:  Your vital signs today were: BP 156/94    Pulse 85    Temp 98.3 F (36.8 C) (Oral)    Resp 20    Ht 6' (1.829 m)    Wt 131.5 kg    SpO2 97%    BMI 39.33 kg/m  If your blood pressure (BP) was elevated above 135/85 this visit, please have this repeated by your doctor within one month. --------------

## 2016-11-28 LAB — RPR: RPR: NONREACTIVE

## 2016-11-28 LAB — HIV ANTIBODY (ROUTINE TESTING W REFLEX): HIV Screen 4th Generation wRfx: NONREACTIVE

## 2016-11-30 LAB — GC/CHLAMYDIA PROBE AMP (~~LOC~~) NOT AT ARMC
Chlamydia: NEGATIVE
Neisseria Gonorrhea: NEGATIVE

## 2017-08-25 ENCOUNTER — Emergency Department (INDEPENDENT_AMBULATORY_CARE_PROVIDER_SITE_OTHER)
Admission: EM | Admit: 2017-08-25 | Discharge: 2017-08-25 | Disposition: A | Discharge status: Home / Self Care | Payer: BLUE CROSS/BLUE SHIELD | Source: Home / Self Care | Attending: Family Medicine | Admitting: Family Medicine

## 2017-08-25 ENCOUNTER — Encounter: Payer: Self-pay | Admitting: Emergency Medicine

## 2017-08-25 DIAGNOSIS — A6001 Herpesviral infection of penis: Secondary | ICD-10-CM

## 2017-08-25 DIAGNOSIS — Z760 Encounter for issue of repeat prescription: Secondary | ICD-10-CM

## 2017-08-25 DIAGNOSIS — R030 Elevated blood-pressure reading, without diagnosis of hypertension: Secondary | ICD-10-CM

## 2017-08-25 HISTORY — DX: Herpesviral infection, unspecified: B00.9

## 2017-08-25 MED ORDER — VALACYCLOVIR HCL 500 MG PO TABS: 500.0000 mg | ORAL_TABLET | Freq: Two times a day (BID) | ORAL | 1 refills | Status: DC

## 2017-08-25 NOTE — ED Triage Notes (Signed)
Herpes outbreak today, one lesion on penis

## 2017-08-25 NOTE — ED Provider Notes (Signed)
[facility]    CSN: [id number] Arrival date & time: 08/25/17  1745     History   Chief Complaint Chief Complaint  Patient presents with  . Rash  . Medication Refill    HPI Nathaniel Wilkerson is a 31 y.o. male.   HPI  Nathaniel Wilkerson is a 31 y.o. male presenting to UC with c/o genital herpes simplex outbreak that started earlier today. He does not have a PCP at this time but he scheduled a new patient appointment for October.  He is requesting a refill of his Valtrex. He has a few outbreaks a year. Denies fever, chills, n/v/d. Denies dysuria or penile discharge.    Past Medical History:  Diagnosis Date  . Abnormal EKG 06/30/2016  . HSV-2 infection   . Numbness and tingling in left arm 06/30/2016  . Occipital headache 06/30/2016    Patient Active Problem List   Diagnosis Date Noted  . Occipital headache 06/30/2016  . Numbness and tingling in left arm 06/30/2016  . Abnormal EKG 06/30/2016    History reviewed. No pertinent surgical history.     Home Medications    Prior to Admission medications   Medication Sig Start Date End Date Taking? Authorizing Provider  acetaminophen (TYLENOL) 325 MG tablet Take 650 mg by mouth every 6 (six) hours as needed (pain).    [provider]  valACYclovir (VALTREX) 500 MG tablet Take 1 tablet (500 mg total) by mouth 2 (two) times daily. For 3 days 08/25/17   Rolla Plate    Family History Family History  Problem Relation Age of Onset  . Hypertension Mother   . Hypertension Father   . Heart attack Maternal Grandfather   . Hypertension Maternal Grandfather   . Heart attack Paternal Grandfather   . Kidney cancer Paternal Grandfather   . Diabetes Paternal Grandfather   . Hypertension Maternal Grandmother   . Diabetes Maternal Grandmother   . Hypertension Paternal Grandmother     Social History Social History  Substance Use Topics  . Smoking status: Former Games developer  . Smokeless tobacco: Never Used  . Alcohol  use Yes     Allergies   Patient has no known allergies.   Review of Systems Review of Systems  Constitutional: Negative for chills and fever.  Genitourinary: Positive for genital sores. Negative for dysuria and hematuria.  Skin: Positive for rash. Negative for wound.     Physical Exam Triage Vital Signs ED Triage Vitals  Enc Vitals Group     BP 08/25/17 1801 (!) 151/100     Pulse Rate 08/25/17 1801 71     Resp --      Temp 08/25/17 1801 98.5 F (36.9 C)     Temp Source 08/25/17 1801 Oral     SpO2 08/25/17 1801 98 %     Weight 08/25/17 1802 298 lb (135.2 kg)     Height 08/25/17 1802  (1.803 m)     Head Circumference --      Peak Flow --      Pain Score 08/25/17 1802 4     Pain Loc --      Pain Edu? --      Excl. in GC? --    No data found.   Updated Vital Signs BP (!) 151/100 (BP Location: Left Arm)   Pulse 71   Temp 98.5 F (36.9 C) (Oral)   Ht  (1.803 m)   Wt 298 lb (135.2 kg)  SpO2 98%   BMI 41.56 kg/m   Visual Acuity Right Eye Distance:   Left Eye Distance:   Bilateral Distance:    Right Eye Near:   Left Eye Near:    Bilateral Near:     Physical Exam  Constitutional: He is oriented to person, place, and time. He appears well-developed and well-nourished.  HENT:  Head: Normocephalic and atraumatic.  Eyes: EOM are normal.  Neck: Normal range of motion.  Cardiovascular: Normal rate.   Pulmonary/Chest: Effort normal.  Genitourinary:  Genitourinary Comments: Chaperoned exam. Multiple erythematous grouped vesicles at base of penis. Tender. No discharge.   Musculoskeletal: Normal range of motion.  Neurological: He is alert and oriented to person, place, and time.  Skin: Skin is warm and dry.  Psychiatric: He has a normal mood and affect. His behavior is normal.  Nursing note and vitals reviewed.    UC Treatments / Results  Labs (all labs ordered are listed, but only abnormal results are displayed) Labs Reviewed - No data to  display  EKG  EKG Interpretation None       Radiology No results found.  Procedures Procedures (including critical care time)  Medications Ordered in UC Medications - No data to display   Initial Impression / Assessment and Plan / UC Course  I have reviewed the triage vital signs and the nursing notes.  Pertinent labs & imaging results that were available during my care of the patient were reviewed by me and considered in my medical decision making (see chart for details).     Hx and exam c/w genital herpes rash.  Valtrex sent to pharmacy BP elevated in triage. He notes he has been told this in the past. Encouraged to keep appointment with new PCP next month for medication refills and BP monitoring/treatment.   Final Clinical Impressions(s) / UC Diagnoses   Final diagnoses:  Herpes simplex infection of penis  Medication refill  Elevated blood pressure reading    New Prescriptions Discharge Medication List as of 08/25/2017  6:10 PM    START taking these medications   Details  valACYclovir (VALTREX) 500 MG tablet Take 1 tablet (500 mg total) by mouth 2 (two) times daily. For 3 days, Starting Thu 08/25/2017, Normal         Controlled Substance Prescriptions [place] Controlled Substance Registry consulted? Not Applicable   Rolla Plate 08/25/17 1815

## 2017-09-15 DIAGNOSIS — I10 Essential (primary) hypertension: Secondary | ICD-10-CM | POA: Insufficient documentation

## 2017-10-11 DIAGNOSIS — A6001 Herpesviral infection of penis: Secondary | ICD-10-CM | POA: Insufficient documentation

## 2017-10-11 DIAGNOSIS — G4733 Obstructive sleep apnea (adult) (pediatric): Secondary | ICD-10-CM | POA: Insufficient documentation

## 2018-08-25 DIAGNOSIS — M10071 Idiopathic gout, right ankle and foot: Secondary | ICD-10-CM | POA: Insufficient documentation

## 2018-08-25 DIAGNOSIS — F4310 Post-traumatic stress disorder, unspecified: Secondary | ICD-10-CM | POA: Insufficient documentation

## 2019-05-17 ENCOUNTER — Other Ambulatory Visit: Payer: Self-pay

## 2019-05-17 ENCOUNTER — Encounter: Payer: Self-pay | Admitting: *Deleted

## 2019-05-17 ENCOUNTER — Observation Stay
Admission: RE | Admit: 2019-05-17 | Discharge: 2019-05-18 | Disposition: A | Discharge status: Home / Self Care | Payer: BC Managed Care – PPO | Source: Ambulatory Visit | Attending: Surgery | Admitting: Surgery

## 2019-05-17 ENCOUNTER — Ambulatory Visit: Payer: Self-pay | Admitting: Surgery

## 2019-05-17 ENCOUNTER — Ambulatory Visit: Payer: BC Managed Care – PPO | Admitting: Certified Registered"

## 2019-05-17 ENCOUNTER — Encounter
Admission: RE | Disposition: A | Discharge status: Home / Self Care | Payer: Self-pay | Source: Ambulatory Visit | Attending: Surgery

## 2019-05-17 DIAGNOSIS — Z1159 Encounter for screening for other viral diseases: Secondary | ICD-10-CM | POA: Diagnosis not present

## 2019-05-17 DIAGNOSIS — Z79899 Other long term (current) drug therapy: Secondary | ICD-10-CM | POA: Insufficient documentation

## 2019-05-17 DIAGNOSIS — K421 Umbilical hernia with gangrene: Principal | ICD-10-CM | POA: Insufficient documentation

## 2019-05-17 DIAGNOSIS — I10 Essential (primary) hypertension: Secondary | ICD-10-CM | POA: Insufficient documentation

## 2019-05-17 DIAGNOSIS — K420 Umbilical hernia with obstruction, without gangrene: Secondary | ICD-10-CM | POA: Diagnosis present

## 2019-05-17 HISTORY — PX: INGUINAL HERNIA REPAIR: SHX194

## 2019-05-17 LAB — SARS CORONAVIRUS 2 BY RT PCR (HOSPITAL ORDER, PERFORMED IN ~~LOC~~ HOSPITAL LAB): SARS Coronavirus 2: NEGATIVE

## 2019-05-17 SURGERY — REPAIR, HERNIA, INGUINAL, INCARCERATED
Anesthesia: General | Site: Abdomen

## 2019-05-17 MED ORDER — GABAPENTIN 300 MG PO CAPS
ORAL_CAPSULE | ORAL | Status: AC
  Administered 2019-05-17: 19:00:00 300 mg via ORAL
  Filled 2019-05-17: qty 1

## 2019-05-17 MED ORDER — PROPOFOL 10 MG/ML IV BOLUS
INTRAVENOUS | Status: DC | PRN
  Administered 2019-05-17: 200 mg via INTRAVENOUS

## 2019-05-17 MED ORDER — ONDANSETRON HCL 4 MG/2ML IJ SOLN
INTRAMUSCULAR | Status: AC
  Administered 2019-05-17: 4 mg via INTRAVENOUS
  Filled 2019-05-17: qty 2

## 2019-05-17 MED ORDER — ACETAMINOPHEN 325 MG PO TABS: 650.0000 mg | ORAL_TABLET | Freq: Four times a day (QID) | ORAL | Status: DC | PRN

## 2019-05-17 MED ORDER — GLYCOPYRROLATE 0.2 MG/ML IJ SOLN
INTRAMUSCULAR | Status: DC | PRN
  Administered 2019-05-17: 0.2 mg via INTRAVENOUS

## 2019-05-17 MED ORDER — CEFAZOLIN SODIUM-DEXTROSE 2-4 GM/100ML-% IV SOLN
2.0000 g | INTRAVENOUS | Status: AC
  Administered 2019-05-17: 20:00:00 2 g via INTRAVENOUS

## 2019-05-17 MED ORDER — SUGAMMADEX SODIUM 200 MG/2ML IV SOLN
INTRAVENOUS | Status: DC | PRN
  Administered 2019-05-17: 275 mg via INTRAVENOUS

## 2019-05-17 MED ORDER — VALACYCLOVIR HCL 500 MG PO TABS
500.0000 mg | ORAL_TABLET | Freq: Two times a day (BID) | ORAL | Status: DC
  Filled 2019-05-17 (×2): qty 1

## 2019-05-17 MED ORDER — IBUPROFEN 400 MG PO TABS: 600.0000 mg | ORAL_TABLET | Freq: Four times a day (QID) | ORAL | Status: DC | PRN

## 2019-05-17 MED ORDER — FENTANYL CITRATE (PF) 100 MCG/2ML IJ SOLN
25.0000 ug | INTRAMUSCULAR | Status: DC | PRN
  Administered 2019-05-17: 22:00:00 25 ug via INTRAVENOUS

## 2019-05-17 MED ORDER — DOCUSATE SODIUM 100 MG PO CAPS: 100.0000 mg | ORAL_CAPSULE | Freq: Two times a day (BID) | ORAL | 0 refills | Status: DC | PRN

## 2019-05-17 MED ORDER — CEFAZOLIN SODIUM-DEXTROSE 2-4 GM/100ML-% IV SOLN
INTRAVENOUS | Status: AC
  Filled 2019-05-17: qty 100

## 2019-05-17 MED ORDER — BUPIVACAINE-EPINEPHRINE (PF) 0.5% -1:200000 IJ SOLN
INTRAMUSCULAR | Status: AC
  Filled 2019-05-17: qty 30

## 2019-05-17 MED ORDER — LIDOCAINE HCL (CARDIAC) PF 100 MG/5ML IV SOSY
PREFILLED_SYRINGE | INTRAVENOUS | Status: DC | PRN
  Administered 2019-05-17: 50 mg via INTRAVENOUS

## 2019-05-17 MED ORDER — DEXAMETHASONE SODIUM PHOSPHATE 10 MG/ML IJ SOLN
INTRAMUSCULAR | Status: DC | PRN
  Administered 2019-05-17: 10 mg via INTRAVENOUS

## 2019-05-17 MED ORDER — MIDAZOLAM HCL 2 MG/2ML IJ SOLN
INTRAMUSCULAR | Status: DC | PRN
  Administered 2019-05-17: 2 mg via INTRAVENOUS

## 2019-05-17 MED ORDER — ACETAMINOPHEN 500 MG PO TABS
ORAL_TABLET | ORAL | Status: AC
  Filled 2019-05-17: qty 2

## 2019-05-17 MED ORDER — DEXMEDETOMIDINE HCL IN NACL 200 MCG/50ML IV SOLN
INTRAVENOUS | Status: DC | PRN
  Administered 2019-05-17 (×2): 20 ug via INTRAVENOUS

## 2019-05-17 MED ORDER — CELECOXIB 200 MG PO CAPS
200.0000 mg | ORAL_CAPSULE | ORAL | Status: AC
  Administered 2019-05-17: 200 mg via ORAL

## 2019-05-17 MED ORDER — ROCURONIUM BROMIDE 100 MG/10ML IV SOLN
INTRAVENOUS | Status: DC | PRN
  Administered 2019-05-17: 25 mg via INTRAVENOUS

## 2019-05-17 MED ORDER — LACTATED RINGERS IV SOLN
INTRAVENOUS | Status: DC
  Administered 2019-05-17: 19:00:00 via INTRAVENOUS

## 2019-05-17 MED ORDER — CHLORHEXIDINE GLUCONATE CLOTH 2 % EX PADS: 6.0000 | MEDICATED_PAD | Freq: Once | CUTANEOUS | Status: DC

## 2019-05-17 MED ORDER — ONDANSETRON 4 MG PO TBDP: 4.0000 mg | ORAL_TABLET | Freq: Four times a day (QID) | ORAL | Status: DC | PRN

## 2019-05-17 MED ORDER — ACETAMINOPHEN 650 MG RE SUPP: 650.0000 mg | Freq: Four times a day (QID) | RECTAL | Status: DC | PRN

## 2019-05-17 MED ORDER — FENTANYL CITRATE (PF) 100 MCG/2ML IJ SOLN
INTRAMUSCULAR | Status: AC
  Filled 2019-05-17: qty 2

## 2019-05-17 MED ORDER — BUPIVACAINE LIPOSOME 1.3 % IJ SUSP
INTRAMUSCULAR | Status: AC
  Filled 2019-05-17: qty 20

## 2019-05-17 MED ORDER — GLYCOPYRROLATE 0.2 MG/ML IJ SOLN
INTRAMUSCULAR | Status: AC
  Filled 2019-05-17: qty 1

## 2019-05-17 MED ORDER — ONDANSETRON HCL 4 MG/2ML IJ SOLN
INTRAMUSCULAR | Status: AC
  Filled 2019-05-17: qty 2

## 2019-05-17 MED ORDER — ONDANSETRON HCL 4 MG/2ML IJ SOLN
INTRAMUSCULAR | Status: DC | PRN
  Administered 2019-05-17: 4 mg via INTRAVENOUS

## 2019-05-17 MED ORDER — HYDROCODONE-ACETAMINOPHEN 5-325 MG PO TABS: 1.0000 | ORAL_TABLET | Freq: Four times a day (QID) | ORAL | 0 refills | Status: DC | PRN

## 2019-05-17 MED ORDER — HYDROCODONE-ACETAMINOPHEN 5-325 MG PO TABS: 1.0000 | ORAL_TABLET | ORAL | Status: DC | PRN

## 2019-05-17 MED ORDER — DEXAMETHASONE SODIUM PHOSPHATE 10 MG/ML IJ SOLN
INTRAMUSCULAR | Status: AC
  Filled 2019-05-17: qty 1

## 2019-05-17 MED ORDER — ONDANSETRON HCL 4 MG/2ML IJ SOLN
4.0000 mg | Freq: Once | INTRAMUSCULAR | Status: AC | PRN
  Administered 2019-05-17: 4 mg via INTRAVENOUS

## 2019-05-17 MED ORDER — FAMOTIDINE 20 MG PO TABS
ORAL_TABLET | ORAL | Status: AC
  Filled 2019-05-17: qty 1

## 2019-05-17 MED ORDER — AMLODIPINE BESYLATE 10 MG PO TABS
10.0000 mg | ORAL_TABLET | Freq: Every day | ORAL | Status: DC
  Filled 2019-05-17: qty 1

## 2019-05-17 MED ORDER — FENTANYL CITRATE (PF) 100 MCG/2ML IJ SOLN
INTRAMUSCULAR | Status: DC | PRN
  Administered 2019-05-17: 100 ug via INTRAVENOUS
  Administered 2019-05-17 (×2): 50 ug via INTRAVENOUS

## 2019-05-17 MED ORDER — FAMOTIDINE 20 MG PO TABS
20.0000 mg | ORAL_TABLET | Freq: Once | ORAL | Status: AC
  Administered 2019-05-17: 20 mg via ORAL

## 2019-05-17 MED ORDER — CELECOXIB 200 MG PO CAPS
ORAL_CAPSULE | ORAL | Status: AC
  Filled 2019-05-17: qty 1

## 2019-05-17 MED ORDER — BUPIVACAINE LIPOSOME 1.3 % IJ SUSP
INTRAMUSCULAR | Status: DC | PRN
  Administered 2019-05-17: 20 mL

## 2019-05-17 MED ORDER — ACETAMINOPHEN 500 MG PO TABS
1000.0000 mg | ORAL_TABLET | ORAL | Status: AC
  Administered 2019-05-17: 1000 mg via ORAL

## 2019-05-17 MED ORDER — GABAPENTIN 300 MG PO CAPS
300.0000 mg | ORAL_CAPSULE | ORAL | Status: AC
  Administered 2019-05-17: 300 mg via ORAL

## 2019-05-17 MED ORDER — ONDANSETRON HCL 4 MG/2ML IJ SOLN: 4.0000 mg | Freq: Four times a day (QID) | INTRAMUSCULAR | Status: DC | PRN

## 2019-05-17 MED ORDER — ACETAMINOPHEN 325 MG PO TABS: 650.0000 mg | ORAL_TABLET | Freq: Three times a day (TID) | ORAL | 0 refills | Status: DC | PRN

## 2019-05-17 MED ORDER — BUPIVACAINE-EPINEPHRINE (PF) 0.5% -1:200000 IJ SOLN
INTRAMUSCULAR | Status: DC | PRN
  Administered 2019-05-17: 6 mL

## 2019-05-17 MED ORDER — SUCCINYLCHOLINE CHLORIDE 20 MG/ML IJ SOLN
INTRAMUSCULAR | Status: DC | PRN
  Administered 2019-05-17: 120 mg via INTRAVENOUS

## 2019-05-17 MED ORDER — IBUPROFEN 800 MG PO TABS: 800.0000 mg | ORAL_TABLET | Freq: Three times a day (TID) | ORAL | 0 refills | Status: DC | PRN

## 2019-05-17 MED ORDER — LIDOCAINE HCL (PF) 2 % IJ SOLN
INTRAMUSCULAR | Status: AC
  Filled 2019-05-17: qty 10

## 2019-05-17 MED ORDER — ENOXAPARIN SODIUM 40 MG/0.4ML ~~LOC~~ SOLN: 40.0000 mg | SUBCUTANEOUS | Status: DC

## 2019-05-17 SURGICAL SUPPLY — 37 items
BLADE SURG 15 STRL LF DISP TIS (BLADE) ×1 IMPLANT
BLADE SURG 15 STRL SS (BLADE) ×2
CANISTER SUCT 1200ML W/VALVE (MISCELLANEOUS) ×3 IMPLANT
CHLORAPREP W/TINT 26 (MISCELLANEOUS) ×3 IMPLANT
COVER WAND RF STERILE (DRAPES) ×3 IMPLANT
DERMABOND ADVANCED (GAUZE/BANDAGES/DRESSINGS) ×2
DERMABOND ADVANCED .7 DNX12 (GAUZE/BANDAGES/DRESSINGS) ×1 IMPLANT
DRAPE LAPAROTOMY 77X122 PED (DRAPES) ×3 IMPLANT
ELECT REM PT RETURN 9FT ADLT (ELECTROSURGICAL) ×3
ELECTRODE REM PT RTRN 9FT ADLT (ELECTROSURGICAL) ×1 IMPLANT
GLOVE BIO SURGEON STRL SZ7.5 (GLOVE) ×6 IMPLANT
GLOVE BIOGEL M 7.0 STRL (GLOVE) ×2 IMPLANT
GLOVE BIOGEL PI IND STRL 7.0 (GLOVE) ×1 IMPLANT
GLOVE BIOGEL PI INDICATOR 7.0 (GLOVE) ×2
GLOVE INDICATOR 7.5 STRL GRN (GLOVE) ×4 IMPLANT
GLOVE SURG SYN 6.5 ES PF (GLOVE) ×3 IMPLANT
GLOVE SURG SYN 6.5 PF PI (GLOVE) ×1 IMPLANT
GOWN STRL REUS W/ TWL LRG LVL3 (GOWN DISPOSABLE) ×3 IMPLANT
GOWN STRL REUS W/TWL LRG LVL3 (GOWN DISPOSABLE) ×4
KIT TURNOVER KIT A (KITS) ×3 IMPLANT
LABEL OR SOLS (LABEL) ×3 IMPLANT
MESH VENTRALEX ST 2.5 CRC MED (Mesh General) ×2 IMPLANT
NEEDLE HYPO 22GX1.5 SAFETY (NEEDLE) ×5 IMPLANT
NS IRRIG 1000ML POUR BTL (IV SOLUTION) ×2 IMPLANT
NS IRRIG 500ML POUR BTL (IV SOLUTION) ×1 IMPLANT
PACK BASIN MINOR ARMC (MISCELLANEOUS) ×3 IMPLANT
SUT ETHIBOND NAB MO 7 #0 18IN (SUTURE) ×3 IMPLANT
SUT MNCRL 4-0 (SUTURE) ×2
SUT MNCRL 4-0 27XMFL (SUTURE) ×1
SUT VIC AB 2-0 SH 27 (SUTURE) ×2
SUT VIC AB 2-0 SH 27XBRD (SUTURE) ×1 IMPLANT
SUT VIC AB 3-0 SH 27 (SUTURE) ×2
SUT VIC AB 3-0 SH 27X BRD (SUTURE) ×1 IMPLANT
SUTURE MNCRL 4-0 27XMF (SUTURE) ×1 IMPLANT
SYR 10ML LL (SYRINGE) ×6 IMPLANT
TOWEL OR 17X26 4PK STRL BLUE (TOWEL DISPOSABLE) ×1 IMPLANT
WATER STERILE IRR 1000ML POUR (IV SOLUTION) ×1 IMPLANT

## 2019-05-17 NOTE — Anesthesia Post-op Follow-up Note (Signed)
Anesthesia QCDR form completed.        

## 2019-05-17 NOTE — H&P (Signed)
Subjective:   CC: Umbilical hernia with gangrene [K42.1]  HPI:  Nathaniel Wilkerson is a 33 y.o. male who was referred by self for above.  Hx of reducible umbilical hernia noted three days ago.  Noticed the umbilical hernia recur today, but unable to reduce, increasing tenderness.  Seen at urgent care, and provider unable to reduce either, so urgently referred to [place].  He can tolerate pain for now, as long as no one is touching the area.   Past Medical History:  has a past medical history of Hypertension and Inguinal hernia.  Past Surgical History: inguinal hernia surgery  Family History: family history includes Diabetes in his maternal grandmother, paternal grandfather, and paternal grandmother; High blood pressure (Hypertension) in his father and mother.  Social History:  reports that he has never smoked. He has never used smokeless tobacco. He reports current alcohol use of about 4.0 standard drinks of alcohol per week. No history on file for drug.  Current Medications: has a current medication list which includes the following prescription(s): amlodipine, valacyclovir, hydrochlorothiazide, and hydrocodone-acetaminophen.  Allergies:       Allergies as of 05/17/2019 - Reviewed 05/17/2019  Allergen Reaction Noted  . Hydrochlorothiazide Other (See Comments) 05/17/2019    ROS:  A 15 point review of systems was performed and pertinent positives and negatives noted in HPI    Objective:   BP (!) 141/93   Pulse 80   Ht 180.3 cm (5\' 11" )   Wt (!) 141.1 kg (311 lb)   BMI 43.38 kg/m    Constitutional :  alert, appears stated age, cooperative and no distress  Lymphatics/Throat:  no asymmetry, masses, or scars  Respiratory:  clear to auscultation bilaterally  Cardiovascular:  regular rate and rhythm  Gastrointestinal: soft, non-tender; bowel sounds normal; no masses,  no organomegaly.  Incarcerated, very tender umbilical hernia, unable to reduce at bedside  Musculoskeletal:  Steady gait and movement  Skin: Cool and moist  Psychiatric: Normal affect, non-agitated, not confused       LABS:  n/a   RADS: n/a Assessment:      Umbilical hernia with gangrene [K42.1]  Plan:   1. Umbilical hernia with gangrene [K42.1] Discussed the risk of surgery including post-op infxn, seroma, biloma, chronic pain, poor-delayed wound healing, retained gallstone, conversion to open procedure, post-op SBO or ileus, and need for additional procedures to address said risks.  The risks of general anesthetic including MI, CVA, sudden death or even reaction to anesthetic medications also discussed. Alternatives include continued observation.  Benefits include possible symptom relief, prevention of complications including acute cholecystitis, pancreatitis.  Typical post operative recovery of 3-5 days rest, continued pain in area and incision sites, possible loose stools up to 4-6 weeks, also discussed.  ED return precautions given for sudden increase in RUQ pain, with possible accompanying fever, nausea, and/or vomiting.  The patient understands the risks, any and all questions were answered to the patient's satisfaction.  2. Due to worsening symptoms of a newly incarcerated, likely strangulated hernia, will take patient emergently to OR for repair.       Electronically signed by Benjamine Sprague, DO on 05/17/2019 4:35 PM

## 2019-05-17 NOTE — Anesthesia Procedure Notes (Signed)
Procedure Name: Intubation Performed by: Rolla Plate, CRNA Pre-anesthesia Checklist: Patient identified, Patient being monitored, Timeout performed, Emergency Drugs available and Suction available Patient Re-evaluated:Patient Re-evaluated prior to induction Oxygen Delivery Method: Circle system utilized Preoxygenation: Pre-oxygenation with 100% oxygen Induction Type: IV induction Ventilation: Mask ventilation without difficulty Laryngoscope Size: 3 and McGraph Grade View: Grade I Tube type: Oral Tube size: 7.5 mm Number of attempts: 1 Airway Equipment and Method: Stylet and Video-laryngoscopy Placement Confirmation: ETT inserted through vocal cords under direct vision,  positive ETCO2 and breath sounds checked- equal and bilateral Secured at: 23 cm Tube secured with: Tape Dental Injury: Teeth and Oropharynx as per pre-operative assessment

## 2019-05-17 NOTE — Transfer of Care (Signed)
Immediate Anesthesia Transfer of Care Note  Patient: Nathaniel Wilkerson  Procedure(s) Performed: HERNIA REPAIR umbilical INCARCERATED (N/A Abdomen)  Patient Location: PACU  Anesthesia Type:General  Level of Consciousness: sedated  Airway & Oxygen Therapy: Patient Spontanous Breathing and Patient connected to face mask oxygen  Post-op Assessment: Report given to RN and Post -op Vital signs reviewed and stable  Post vital signs: Reviewed  Last Vitals:  Vitals Value Taken Time  BP 119/68 05/17/19 2144  Temp 36.6 C 05/17/19 2142  Pulse 83 05/17/19 2144  Resp 12 05/17/19 2144  SpO2 92 % 05/17/19 2144    Last Pain:  Vitals:   05/17/19 1733  PainSc: 3          Complications: No apparent anesthesia complications

## 2019-05-17 NOTE — Interval H&P Note (Signed)
History and Physical Interval Note:  05/17/2019 7:42 PM  Nathaniel Wilkerson  has presented today for surgery, with the diagnosis of K42.1 STRANULATED UMBILICAL HERNIA.  The various methods of treatment have been discussed with the patient and family. After consideration of risks, benefits and other options for treatment, the patient has consented to  Procedure(s): HERNIA REPAIR umbilical INCARCERATED (N/A) as a surgical intervention.  The patient's history has been reviewed, patient examined, no change in status, stable for surgery.  I have reviewed the patient's chart and labs.  Questions were answered to the patient's satisfaction.     Bambie Pizzolato Lysle Pearl

## 2019-05-17 NOTE — Anesthesia Preprocedure Evaluation (Signed)
Anesthesia Evaluation  Patient identified by MRN, date of birth, ID band Patient awake    Reviewed: Allergy & Precautions, H&P , NPO status , Patient's Chart, lab work & pertinent test results, reviewed documented beta blocker date and time   Airway Mallampati: II  TM Distance: >3 FB Neck ROM: full    Dental  (+) Teeth Intact   Pulmonary neg pulmonary ROS, former smoker,    Pulmonary exam normal        Cardiovascular Exercise Tolerance: Good negative cardio ROS Normal cardiovascular exam Rhythm:regular Rate:Normal     Neuro/Psych  Headaches, negative psych ROS   GI/Hepatic negative GI ROS, Neg liver ROS,   Endo/Other  negative endocrine ROS  Renal/GU negative Renal ROS  negative genitourinary   Musculoskeletal   Abdominal   Peds  Hematology negative hematology ROS (+)   Anesthesia Other Findings Past Medical History: 06/30/2016: Abnormal EKG 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 No pertinent surgical history. BMI    Body Mass Index: 42.54 kg/m     Reproductive/Obstetrics negative OB ROS                             Anesthesia Physical Anesthesia Plan  ASA: III and emergent  Anesthesia Plan: General LMA   Post-op Pain Management:    Induction:   PONV Risk Score and Plan:   Airway Management Planned:   Additional Equipment:   Intra-op Plan:   Post-operative Plan:   Informed Consent: I have reviewed the patients History and Physical, chart, labs and discussed the procedure including the risks, benefits and alternatives for the proposed anesthesia with the patient or authorized representative who has indicated his/her understanding and acceptance.     Dental Advisory Given  Plan Discussed with: CRNA  Anesthesia Plan Comments:         Anesthesia Quick Evaluation

## 2019-05-17 NOTE — H&P (View-Only) (Signed)
Subjective:   CC: Umbilical hernia with gangrene [K42.1]  HPI:  Nathaniel Wilkerson is a 33 y.o. male who was referred by self for above.  Hx of reducible umbilical hernia noted three days ago.  Noticed the umbilical hernia recur today, but unable to reduce, increasing tenderness.  Seen at urgent care, and provider unable to reduce either, so urgently referred to us.  He can tolerate pain for now, as long as no one is touching the area.   Past Medical History:  has a past medical history of Hypertension and Inguinal hernia.  Past Surgical History: inguinal hernia surgery  Family History: family history includes Diabetes in his maternal grandmother, paternal grandfather, and paternal grandmother; High blood pressure (Hypertension) in his father and mother.  Social History:  reports that he has never smoked. He has never used smokeless tobacco. He reports current alcohol use of about 4.0 standard drinks of alcohol per week. No history on file for drug.  Current Medications: has a current medication list which includes the following prescription(s): amlodipine, valacyclovir, hydrochlorothiazide, and hydrocodone-acetaminophen.  Allergies:       Allergies as of 05/17/2019 - Reviewed 05/17/2019  Allergen Reaction Noted  . Hydrochlorothiazide Other (See Comments) 05/17/2019    ROS:  A 15 point review of systems was performed and pertinent positives and negatives noted in HPI    Objective:   BP (!) 141/93   Pulse 80   Ht 180.3 cm (5' 11")   Wt (!) 141.1 kg (311 lb)   BMI 43.38 kg/m    Constitutional :  alert, appears stated age, cooperative and no distress  Lymphatics/Throat:  no asymmetry, masses, or scars  Respiratory:  clear to auscultation bilaterally  Cardiovascular:  regular rate and rhythm  Gastrointestinal: soft, non-tender; bowel sounds normal; no masses,  no organomegaly.  Incarcerated, very tender umbilical hernia, unable to reduce at bedside  Musculoskeletal:  Steady gait and movement  Skin: Cool and moist  Psychiatric: Normal affect, non-agitated, not confused       LABS:  n/a   RADS: n/a Assessment:      Umbilical hernia with gangrene [K42.1]  Plan:   1. Umbilical hernia with gangrene [K42.1] Discussed the risk of surgery including post-op infxn, seroma, biloma, chronic pain, poor-delayed wound healing, retained gallstone, conversion to open procedure, post-op SBO or ileus, and need for additional procedures to address said risks.  The risks of general anesthetic including MI, CVA, sudden death or even reaction to anesthetic medications also discussed. Alternatives include continued observation.  Benefits include possible symptom relief, prevention of complications including acute cholecystitis, pancreatitis.  Typical post operative recovery of 3-5 days rest, continued pain in area and incision sites, possible loose stools up to 4-6 weeks, also discussed.  ED return precautions given for sudden increase in RUQ pain, with possible accompanying fever, nausea, and/or vomiting.  The patient understands the risks, any and all questions were answered to the patient's satisfaction.  2. Due to worsening symptoms of a newly incarcerated, likely strangulated hernia, will take patient emergently to OR for repair.       Electronically signed by Tamirah George, DO on 05/17/2019 4:35 PM     

## 2019-05-18 ENCOUNTER — Other Ambulatory Visit: Payer: Self-pay

## 2019-05-18 DIAGNOSIS — K421 Umbilical hernia with gangrene: Secondary | ICD-10-CM | POA: Diagnosis not present

## 2019-05-18 LAB — CBC
HCT: 39 % (ref 39.0–52.0)
Hemoglobin: 13.2 g/dL (ref 13.0–17.0)
MCH: 28.8 pg (ref 26.0–34.0)
MCHC: 33.8 g/dL (ref 30.0–36.0)
MCV: 85.2 fL (ref 80.0–100.0)
Platelets: 276 10*3/uL (ref 150–400)
RBC: 4.58 MIL/uL (ref 4.22–5.81)
RDW: 12.7 % (ref 11.5–15.5)
WBC: 8.5 10*3/uL (ref 4.0–10.5)
nRBC: 0 % (ref 0.0–0.2)

## 2019-05-18 LAB — CREATININE, SERUM
Creatinine, Ser: 1.51 mg/dL — ABNORMAL HIGH (ref 0.61–1.24)
GFR calc Af Amer: >60 mL/min (ref >60)
GFR calc non Af Amer: 59 mL/min — ABNORMAL LOW (ref >60)

## 2019-05-18 MED ORDER — HYDROCODONE-ACETAMINOPHEN 5-325 MG PO TABS: 1.0000 | ORAL_TABLET | Freq: Four times a day (QID) | ORAL | 0 refills | Status: AC | PRN

## 2019-05-18 MED ORDER — ACETAMINOPHEN 325 MG PO TABS: 650.0000 mg | ORAL_TABLET | Freq: Three times a day (TID) | ORAL | 0 refills | Status: AC | PRN

## 2019-05-18 MED ORDER — IBUPROFEN 800 MG PO TABS: 800.0000 mg | ORAL_TABLET | Freq: Three times a day (TID) | ORAL | 0 refills | Status: DC | PRN

## 2019-05-18 MED ORDER — ACETAMINOPHEN 325 MG PO TABS: 650.0000 mg | ORAL_TABLET | Freq: Three times a day (TID) | ORAL | 0 refills | Status: DC | PRN

## 2019-05-18 MED ORDER — DOCUSATE SODIUM 100 MG PO CAPS: 100.0000 mg | ORAL_CAPSULE | Freq: Two times a day (BID) | ORAL | 0 refills | Status: AC | PRN

## 2019-05-18 NOTE — Progress Notes (Signed)
MD ordered patient to be discharged home.  Discharge instructions were reviewed with the patient and he voiced understanding.  Follow-up appointment was made.  Prescriptions sent to patients' pharmacy.  IV was removed with catheter intact.  All patients questions were answered.  Patient left via wheelchair escorted by nursing.   

## 2019-05-18 NOTE — Plan of Care (Signed)
  Problem: Education: Goal: Knowledge of [facility] Education information/materials will improve Outcome: Progressing Goal: Emotional status will improve Outcome: Progressing Goal: Mental status will improve Outcome: Progressing Goal: Verbalization of understanding the information provided will improve Outcome: Progressing   Problem: Activity: Goal: Interest or engagement in activities will improve Outcome: Progressing Goal: Sleeping patterns will improve Outcome: Progressing   Problem: Coping: Goal: Ability to verbalize frustrations and anger appropriately will improve Outcome: Progressing Goal: Ability to demonstrate self-control will improve Outcome: Progressing   Problem: Health Behavior/Discharge Planning: Goal: Identification of resources available to assist in meeting health care needs will improve Outcome: Progressing Goal: Compliance with treatment plan for underlying cause of condition will improve Outcome: Progressing   Problem: Physical Regulation: Goal: Ability to maintain clinical measurements within normal limits will improve Outcome: Progressing   Problem: Safety: Goal: Periods of time without injury will increase Outcome: Progressing   

## 2019-05-18 NOTE — Op Note (Addendum)
Preoperative diagnosis: umbilical hernia, strangulated Postoperative diagnosis: incarcerated umbilical hernia  Procedure:  Open incarcerated umbilical hernia repair with mesh  Anesthesia: GETA  Surgeon: Benjamine Sprague  Wound Classification: Clean  Specimen: none  Complications: None  Estimated Blood Loss: minimal  Indications:see HPI  Findings: 1. 1.7cm x 1.5cm incarcerated umbilical hernia 4. Tension free repair achieved with mesh and suture 5. Adequate hemostasis  Description of procedure: The patient was brought to the operating room and general anesthesia was induced. A time-out was completed verifying correct patient, procedure, site, positioning, and implant(s) and/or special equipment prior to beginning this procedure. Antibiotics were administered prior to making the incision. SCDs placed. The anterior abdominal wall was prepped and draped in the standard sterile fashion.   An infraumbilical incision was made after infusing the preplanned incision with half percent Marcaine.  Dissection carried down to fascia where the umbilical stalk was noted.  The stalk was transected and there was noted to be H7.33YO x 3.7CH umbilical hernia with incarcerated preperitoneal fat.  The preperitoneal fat contents were dissected off the surrounding structures and reduced.  Hemostasis was confirmed prior to reducing the actual contents. Due to size of hernia and patient's large body habitus, decision was made to proceed with a mesh repair.    A 6cm bard  umbilical mesh was placed within the preperitoneal cavity through the present defect and secured in place on the abdominal wall using interrupted 0 Ethibond sutures x4.  Once the mesh was noted to be laying flat and secured to the abdominal wall the defect itself was primary closed using 0 Ethibond in an interrupted fashion.  The fascia as well as the skin incision was then infused with 20 mL's of Exparel.  After confirming hemostasis, the umbilical  stalk was reattached to the abdominal wall using 2-0 Vicryl and the wound was irrigated and closed in a multilayer fashion, using 3-0 Vicryl for the deep dermal layer in an interrupted fashion and running 4-0 Monocryl in a subcuticular fashion.  Wound was then dressed with Dermabond.  Patient was then successfully awakened and transferred to PACU in stable condition.  At the end of the procedure sponge and instrument counts were correct

## 2019-05-19 NOTE — Discharge Summary (Addendum)
Physician Discharge Summary  Patient ID: Nathaniel Wilkerson MRN: [id number] DOB/AGE: 34-Aug-1987 34 y.o.  Admit date: 05/17/2019 Discharge date:05/18/2019  Admission Diagnoses: strangulated umbilical hernia  Discharge Diagnoses:  Incarcerated umbilical hernia  Discharged Condition: good  Hospital Course: admitted urgently for above and underwent open repair with mesh.  Observed overnight, with no issues, deemed appropriate for d/c  Consults: None  Discharge Exam: Blood pressure 126/69, pulse 66, temperature 98.3 F (36.8 C), temperature source Oral, resp. rate 19, height 5\' 11"  (1.803 m), weight (!) 138.3 kg, SpO2 98 %. General appearance: alert and no distress GI: soft, non-tender; bowel sounds normal; no masses,  no organomegaly  Incisions c/d/i.  Disposition:  Discharge disposition: 01-Home or Self Care       Discharge Instructions    Discharge patient   Complete by: As directed    Discharge disposition: 01-Home or Self Care   Discharge patient date: 05/17/2019     Allergies as of 05/18/2019   No Known Allergies     Medication List    TAKE these medications   acetaminophen 325 MG tablet Commonly known as: Tylenol Take 2 tablets (650 mg total) by mouth every 8 (eight) hours as needed for up to 30 days for mild pain. What changed:   when to take this  reasons to take this   acetaminophen 325 MG tablet Commonly known as: Tylenol Take 2 tablets (650 mg total) by mouth every 8 (eight) hours as needed for up to 30 days for mild pain. What changed: You were already taking a medication with the same name, and this prescription was added. Make sure you understand how and when to take each.   amLODipine 10 MG tablet Commonly known as: NORVASC Take 10 mg by mouth daily.   docusate sodium 100 MG capsule Commonly known as: Colace Take 1 capsule (100 mg total) by mouth 2 (two) times daily as needed for up to 10 days for mild constipation.   HYDROcodone-acetaminophen  5-325 MG tablet Commonly known as: Norco Take 1 tablet by mouth every 6 (six) hours as needed for up to 3 days for moderate pain.   ibuprofen 800 MG tablet Commonly known as: ADVIL Take 1 tablet (800 mg total) by mouth every 8 (eight) hours as needed for mild pain or moderate pain.   valACYclovir 500 MG tablet Commonly known as: VALTREX Take 1 tablet (500 mg total) by mouth 2 (two) times daily. For 3 days      Follow-up Information    Yanelly Cantrelle, DO In 2 weeks.   Specialty: Surgery Why: For wound re-check  [place] Contact information: [place]  [id number] [phone]            Total time spent arranging discharge was >25min. Signed: Benjamine Sprague 05/19/2019, 7:42 PM

## 2019-05-21 LAB — SURGICAL PATHOLOGY

## 2019-05-22 DIAGNOSIS — N289 Disorder of kidney and ureter, unspecified: Secondary | ICD-10-CM | POA: Insufficient documentation

## 2019-05-28 NOTE — Anesthesia Postprocedure Evaluation (Signed)
Anesthesia Post Note  Patient: Nathaniel Wilkerson  Procedure(s) Performed: HERNIA REPAIR umbilical INCARCERATED (N/A Abdomen)  Patient location during evaluation: PACU Anesthesia Type: General Level of consciousness: awake and alert Pain management: pain level controlled Vital Signs Assessment: post-procedure vital signs reviewed and stable Respiratory status: spontaneous breathing, nonlabored ventilation, respiratory function stable and patient connected to nasal cannula oxygen Cardiovascular status: blood pressure returned to baseline and stable Postop Assessment: no apparent nausea or vomiting Anesthetic complications: no     Last Vitals:  Vitals:   05/18/19 0043 05/18/19 0500  BP: 135/60 126/69  Pulse: 61 66  Resp: 17 19  Temp: 36.7 C 36.8 C  SpO2: 98% 98%    Last Pain:  Vitals:   05/18/19 1016  TempSrc:   PainSc: 2                  Molli Barrows

## 2019-05-30 ENCOUNTER — Other Ambulatory Visit: Payer: Self-pay | Admitting: Surgery

## 2019-05-30 ENCOUNTER — Ambulatory Visit
Admission: RE | Admit: 2019-05-30 | Discharge: 2019-05-30 | Disposition: A | Discharge status: Home / Self Care | Payer: BC Managed Care – PPO | Source: Ambulatory Visit | Attending: Surgery | Admitting: Surgery

## 2019-05-30 ENCOUNTER — Other Ambulatory Visit: Payer: Self-pay

## 2019-05-30 DIAGNOSIS — L7634 Postprocedural seroma of skin and subcutaneous tissue following other procedure: Secondary | ICD-10-CM

## 2019-05-30 HISTORY — DX: Essential (primary) hypertension: I10

## 2019-05-30 MED ORDER — IOHEXOL 300 MG/ML  SOLN
100.0000 mL | Freq: Once | INTRAMUSCULAR | Status: AC | PRN
  Administered 2019-05-30: 100 mL via INTRAVENOUS

## 2019-06-11 ENCOUNTER — Encounter: Payer: Self-pay | Admitting: *Deleted

## 2019-06-11 ENCOUNTER — Other Ambulatory Visit: Payer: Self-pay | Admitting: *Deleted

## 2019-06-13 ENCOUNTER — Telehealth: Payer: Self-pay | Admitting: Cardiology

## 2019-06-13 NOTE — Telephone Encounter (Signed)

## 2019-06-15 ENCOUNTER — Ambulatory Visit: Payer: BC Managed Care – PPO | Admitting: Cardiology

## 2019-06-22 ENCOUNTER — Ambulatory Visit: Payer: BC Managed Care – PPO | Attending: Internal Medicine

## 2019-06-22 DIAGNOSIS — G4733 Obstructive sleep apnea (adult) (pediatric): Secondary | ICD-10-CM | POA: Diagnosis not present

## 2019-06-25 ENCOUNTER — Other Ambulatory Visit: Payer: Self-pay

## 2019-07-16 ENCOUNTER — Other Ambulatory Visit: Payer: Self-pay

## 2019-07-16 ENCOUNTER — Other Ambulatory Visit
Admission: RE | Admit: 2019-07-16 | Discharge: 2019-07-16 | Disposition: A | Discharge status: Home / Self Care | Payer: BC Managed Care – PPO | Source: Ambulatory Visit | Attending: Internal Medicine | Admitting: Internal Medicine

## 2019-07-16 DIAGNOSIS — Z20828 Contact with and (suspected) exposure to other viral communicable diseases: Secondary | ICD-10-CM | POA: Insufficient documentation

## 2019-07-16 DIAGNOSIS — Z01812 Encounter for preprocedural laboratory examination: Secondary | ICD-10-CM | POA: Insufficient documentation

## 2019-07-16 LAB — SARS CORONAVIRUS 2 (TAT 6-24 HRS): SARS Coronavirus 2: NEGATIVE

## 2019-07-20 ENCOUNTER — Ambulatory Visit: Payer: BC Managed Care – PPO | Attending: Internal Medicine

## 2019-07-20 DIAGNOSIS — G4733 Obstructive sleep apnea (adult) (pediatric): Secondary | ICD-10-CM | POA: Insufficient documentation

## 2019-07-21 ENCOUNTER — Other Ambulatory Visit: Payer: Self-pay

## 2019-10-17 ENCOUNTER — Telehealth: Payer: BC Managed Care – PPO | Admitting: Nurse Practitioner

## 2019-10-17 DIAGNOSIS — R0981 Nasal congestion: Secondary | ICD-10-CM

## 2019-10-17 DIAGNOSIS — L0293 Carbuncle, unspecified: Secondary | ICD-10-CM | POA: Diagnosis not present

## 2019-10-17 DIAGNOSIS — K591 Functional diarrhea: Secondary | ICD-10-CM

## 2019-10-17 DIAGNOSIS — Z20822 Contact with and (suspected) exposure to covid-19: Secondary | ICD-10-CM

## 2019-10-17 DIAGNOSIS — R432 Parageusia: Secondary | ICD-10-CM

## 2019-10-17 DIAGNOSIS — R430 Anosmia: Secondary | ICD-10-CM

## 2019-10-17 DIAGNOSIS — Z20828 Contact with and (suspected) exposure to other viral communicable diseases: Secondary | ICD-10-CM

## 2019-10-17 DIAGNOSIS — R059 Cough, unspecified: Secondary | ICD-10-CM

## 2019-10-17 DIAGNOSIS — R05 Cough: Secondary | ICD-10-CM

## 2019-10-17 MED ORDER — BENZONATATE 100 MG PO CAPS: 100.0000 mg | ORAL_CAPSULE | Freq: Three times a day (TID) | ORAL | 0 refills | Status: DC | PRN

## 2019-10-17 MED ORDER — SULFAMETHOXAZOLE-TRIMETHOPRIM 800-160 MG PO TABS: 1.0000 | ORAL_TABLET | Freq: Two times a day (BID) | ORAL | 0 refills | Status: DC

## 2019-10-17 NOTE — Progress Notes (Signed)
E-Visit for Corona Virus Screening   Your current symptoms could be consistent with the coronavirus.  Many health care providers can now test patients at their office but not all are.  [facility] has multiple testing sites. For information on our COVID testing locations and hours go to [email or web address]  Please quarantine yourself while awaiting your test results.  We are enrolling you in our MyChart Home [facility] for COVID19 . Daily you will receive a questionnaire within the MyChart website. Our COVID 19 response team willl be monitoriing your responses daily. Please continue good preventive care measures, including:  frequent hand-washing, avoid touching your face, cover coughs/sneezes, stay out of crowds and keep a 6 foot distance from others.    COVID-19 is a respiratory illness with symptoms that are similar to the flu. Symptoms are typically mild to moderate, but there have been cases of severe illness and death due to the virus. The following symptoms may appear 2-14 days after exposure: . Fever . Cough . Shortness of breath or difficulty breathing . Chills . Repeated shaking with chills . Muscle pain . Headache . Sore throat . New loss of taste or smell . Fatigue . Congestion or runny nose . Nausea or vomiting . Diarrhea  If you develop fever/cough/breathlessness, please stay home for 10 days with improving symptoms and until you have had 24 hours of no fever (without taking a fever reducer).  Go to the nearest hospital ED for assessment if fever/cough/breathlessness are severe or illness seems like a threat to life.  It is vitally important that if you feel that you have an infection such as this virus or any other virus that you stay home and away from places where you may spread it to others.  You should avoid contact with people age 65 and older.   You should wear a mask or cloth face covering over your nose and mouth if you must be around other  people or animals, including pets (even at home). Try to stay at least 6 feet away from other people. This will protect the people around you.  You can use medication such as A prescription cough medication called Tessalon Perles 100 mg. You may take 1-2 capsules every 8 hours as needed for cough  You may also take acetaminophen (Tylenol) as needed for fever.   Reduce your risk of any infection by using the same precautions used for avoiding the common cold or flu:  . Wash your hands often with soap and warm water for at least 20 seconds.  If soap and water are not readily available, use an alcohol-based hand sanitizer with at least 60% alcohol.  . If coughing or sneezing, cover your mouth and nose by coughing or sneezing into the elbow areas of your shirt or coat, into a tissue or into your sleeve (not your hands). . Avoid shaking hands with others and consider head nods or verbal greetings only. . Avoid touching your eyes, nose, or mouth with unwashed hands.  . Avoid close contact with people who are sick. . Avoid places or events with large numbers of people in one location, like concerts or sporting events. . Carefully consider travel plans you have or are making. . If you are planning any travel outside or inside the [place], visit the CDC's Travelers' Health webpage for the latest health notices. . If you have some symptoms but not all symptoms, continue to monitor at home and seek medical attention if your symptoms worsen. . If   you are having a medical emergency, call 911.  HOME CARE . Only take medications as instructed by your medical team. . Drink plenty of fluids and get plenty of rest. . A steam or ultrasonic humidifier can help if you have congestion.   GET HELP RIGHT AWAY IF YOU HAVE EMERGENCY WARNING SIGNS** FOR COVID-19. If you or someone is showing any of these signs seek emergency medical care immediately. Call 911 or proceed to your closest emergency facility if: . You develop  worsening high fever. . Trouble breathing . Bluish lips or face . Persistent pain or pressure in the chest . New confusion . Inability to wake or stay awake . You cough up blood. . Your symptoms become more severe  **This list is not all possible symptoms. Contact your medical provider for any symptoms that are sever or concerning to you.   MAKE SURE YOU   Understand these instructions.  Will watch your condition.  Will get help right away if you are not doing well or get worse.  Your e-visit answers were reviewed by a board certified advanced clinical practitioner to complete your personal care plan.  Depending on the condition, your plan could have included both over the counter or prescription medications.  If there is a problem please reply once you have received a response from your provider.  Your safety is important to us.  If you have drug allergies check your prescription carefully.    You can use MyChart to ask questions about today's visit, request a non-urgent call back, or ask for a work or school excuse for 24 hours related to this e-Visit. If it has been greater than 24 hours you will need to follow up with your provider, or enter a new e-Visit to address those concerns. You will get an e-mail in the next two days asking about your experience.  I hope that your e-visit has been valuable and will speed your recovery. Thank you for using e-visits.   5-10 minutes spent reviewing and documenting in chart.  

## 2019-10-17 NOTE — Progress Notes (Signed)
E Visit for Cellulitis ? ?We are sorry that you are not feeling well. Here is how we plan to help! ? ?Based on what you shared with me it looks like you have cellulitis.  Cellulitis looks like areas of skin redness, swelling, and warmth; it develops as a result of bacteria entering under the skin. Little red spots and/or bleeding can be seen in skin, and tiny surface sacs containing fluid can occur. Fever can be present. Cellulitis is almost always on one side of a body, and the lower limbs are the most common site of involvement.  ? ?I have prescribed:  Bactrim DS 1 tablet by mouth twice a day for10 days ? ?HOME CARE: ? ?Take your medications as ordered and take all of them, even if the skin irritation appears to be healing.  ? ?GET HELP RIGHT AWAY IF: ? ?Symptoms that don't begin to go away within 48 hours. ?Severe redness persists or worsens ?If the area turns color, spreads or swells. ?If it blisters and opens, develops yellow-brown crust or bleeds. ?You develop a fever or chills. ?If the pain increases or becomes unbearable.  ?Are unable to keep fluids and food down. ? ?MAKE SURE YOU  ? ?Understand these instructions. ?Will watch your condition. ?Will get help right away if you are not doing well or get worse. ? ?Thank you for choosing an e-visit. ? ?Your e-visit answers were reviewed by a board certified advanced clinical practitioner to complete your personal care plan. Depending upon the condition, your plan could have included both over the counter or prescription medications. ? ?Please review your pharmacy choice. Make sure the pharmacy is open so you can pick up prescription now. If there is a problem, you may contact your provider through MyChart messaging and have the prescription routed to another pharmacy.  Your safety is important to us. If you have drug allergies check your prescription carefully.  ? ?For the next 24 hours you can use MyChart to ask questions about today's visit, request a  non-urgent call back, or ask for a work or school excuse. ?You will get an email in the next two days asking about your experience. I hope that your e-visit has been valuable and will speed your recovery. ? ?5-10 minutes spent reviewing and documenting in chart. ? ? ?

## 2019-10-29 ENCOUNTER — Other Ambulatory Visit: Payer: Self-pay

## 2019-10-29 ENCOUNTER — Ambulatory Visit
Admission: RE | Admit: 2019-10-29 | Discharge: 2019-10-29 | Disposition: A | Discharge status: Home / Self Care | Payer: BC Managed Care – PPO | Source: Ambulatory Visit | Attending: Internal Medicine | Admitting: Internal Medicine

## 2019-10-29 ENCOUNTER — Other Ambulatory Visit: Payer: Self-pay | Admitting: Internal Medicine

## 2019-10-29 ENCOUNTER — Telehealth: Payer: BC Managed Care – PPO | Admitting: Physician Assistant

## 2019-10-29 DIAGNOSIS — M79605 Pain in left leg: Secondary | ICD-10-CM | POA: Diagnosis not present

## 2019-10-29 DIAGNOSIS — M5440 Lumbago with sciatica, unspecified side: Secondary | ICD-10-CM

## 2019-10-29 DIAGNOSIS — R635 Abnormal weight gain: Secondary | ICD-10-CM | POA: Insufficient documentation

## 2019-10-29 NOTE — Progress Notes (Signed)
Based on what you shared with me, I feel your condition warrants further evaluation and I recommend that you be seen for a face to face visit.  I am concerned that you are having numbness and weakness in association with your back pain and this could be a sign of a more serious problem. Please contact your primary care physician practice to be seen. Many offices offer virtual options to be seen via video if you are not comfortable going in person to a medical facility at this time.  If you do not have a PCP, [facility] offers a free physician referral service available at [phone]. Our trained staff has the experience, knowledge and resources to put you in touch with a physician who is right for you.   You also have the option of a video visit through [email or web address]  If you are having a true medical emergency please call 911.  NOTE: If you entered your credit card information for this eVisit, you will not be charged. You may see a "hold" on your card for the $35 but that hold will drop off and you will not have a charge processed.  Your e-visit answers were reviewed by a board certified advanced clinical practitioner to complete your personal care plan.  Thank you for using e-Visits.  Approximately 5 minutes was spent documenting and reviewing patient's chart.

## 2019-10-31 ENCOUNTER — Telehealth: Payer: BC Managed Care – PPO | Admitting: Family

## 2019-10-31 DIAGNOSIS — R112 Nausea with vomiting, unspecified: Secondary | ICD-10-CM | POA: Diagnosis not present

## 2019-10-31 DIAGNOSIS — R197 Diarrhea, unspecified: Secondary | ICD-10-CM

## 2019-10-31 MED ORDER — CIPROFLOXACIN HCL 500 MG PO TABS: 500.0000 mg | ORAL_TABLET | Freq: Two times a day (BID) | ORAL | 0 refills | Status: DC

## 2019-10-31 MED ORDER — ONDANSETRON HCL 4 MG PO TABS: 4.0000 mg | ORAL_TABLET | Freq: Three times a day (TID) | ORAL | 0 refills | Status: DC | PRN

## 2019-10-31 NOTE — Progress Notes (Signed)
We are sorry that you are not feeling well.  Here is how we plan to help!  Based on what you have shared with me it looks like you have Acute Infectious Diarrhea.  Most cases of acute diarrhea are due to infections with virus and bacteria and are self-limited conditions lasting less than 14 days.  For your symptoms you may take Imodium 2 mg tablets that are over the counter at your local pharmacy. Take two tablet now and then one after each loose stool up to 6 a day.  Antibiotics are not needed for most people with diarrhea.   Zofran 4 mg 1 tablet every 8 hours as needed for nausea and vomiting   I have prescribed Cipro 500 mg twice a day for seven days  HOME CARE  We recommend changing your diet to help with your symptoms for the next few days.  Drink plenty of fluids that contain water salt and sugar. Sports drinks such as Gatorade may help.   You may try broths, soups, bananas, applesauce, soft breads, mashed potatoes or crackers.   You are considered infectious for as long as the diarrhea continues. Hand washing or use of alcohol based hand sanitizers is recommend.  It is best to stay out of work or school until your symptoms stop.   GET HELP RIGHT AWAY  If you have dark yellow colored urine or do not pass urine frequently you should drink more fluids.    If your symptoms worsen   If you feel like you are going to pass out (faint)  You have a new problem  MAKE SURE YOU   Understand these instructions.  Will watch your condition.  Will get help right away if you are not doing well or get worse.  Your e-visit answers were reviewed by a board certified advanced clinical practitioner to complete your personal care plan.  Depending on the condition, your plan could have included both over the counter or prescription medications.  If there is a problem please reply  once you have received a response from your provider.  Your safety is important to us.  If you have drug  allergies check your prescription carefully.    You can use MyChart to ask questions about today's visit, request a non-urgent call back, or ask for a work or school excuse for 24 hours related to this e-Visit. If it has been greater than 24 hours you will need to follow up with your provider, or enter a new e-Visit to address those concerns.   You will get an e-mail in the next two days asking about your experience.  I hope that your e-visit has been valuable and will speed your recovery. Thank you for using e-visits.  Approximately 5 minutes was spent documenting and reviewing patient's chart.   

## 2019-11-08 ENCOUNTER — Telehealth: Payer: BC Managed Care – PPO

## 2019-11-09 ENCOUNTER — Telehealth: Payer: BC Managed Care – PPO | Admitting: Physician Assistant

## 2019-11-09 ENCOUNTER — Encounter: Payer: Self-pay | Admitting: Physician Assistant

## 2019-11-09 DIAGNOSIS — R05 Cough: Secondary | ICD-10-CM

## 2019-11-09 DIAGNOSIS — R079 Chest pain, unspecified: Secondary | ICD-10-CM | POA: Diagnosis not present

## 2019-11-09 DIAGNOSIS — R059 Cough, unspecified: Secondary | ICD-10-CM

## 2019-11-09 DIAGNOSIS — M549 Dorsalgia, unspecified: Secondary | ICD-10-CM | POA: Diagnosis not present

## 2019-11-09 NOTE — Progress Notes (Signed)
Based on what you shared with me, I feel your condition warrants further evaluation and I recommend that you be seen for a face to face office visit.   NOTE: If you entered your credit card information for this eVisit, you will not be charged. You may see a "hold" on your card for the $35 but that hold will drop off and you will not have a charge processed.  Nathaniel Wilkerson,  As discussed, due to your symptoms of chest pain, and your request for flu testing, I recommend a face to face evaluation.    If you are having a true medical emergency please call 911.      For an urgent face to face visit, [place] has five urgent care centers for your convenience:      NEW:  [facility] [place] at [place] Get Driving Directions [phone] [place], [place] [address] . 10 am - 6pm Monday - Friday    [place] [facility]) Get Driving Directions [phone] [address] [place], [place] [address] . 10 am to 8 pm Monday-Friday . 12 pm to 8 pm [facility] at [facility] Get Driving Directions [phone] [place], [place], [place] [address] . 8 am to 8 pm Monday-Friday . 9 am to 6 pm Saturday . 11 am to 6 pm Sunday     [facility] at [facility] Get Driving Directions  [phone] [address] [place], [place] [address] . 8 am to 8 pm Monday-Friday . 8 am to 4 pm [facility]gent Care at [place] Get Driving Directions [phone] [address] [place], [place] [address] . 12 pm to 6 pm Monday-Friday      Your e-visit answers were reviewed by a board certified advanced clinical practitioner to complete your personal care plan.  Thank you for using e-Visits.    I spent 5-10 minutes on review and completion of this note- Lacy Duverney [facility]

## 2019-11-12 ENCOUNTER — Telehealth: Payer: BC Managed Care – PPO | Admitting: Physician Assistant

## 2019-11-12 DIAGNOSIS — R0602 Shortness of breath: Secondary | ICD-10-CM

## 2019-11-12 DIAGNOSIS — U071 COVID-19: Secondary | ICD-10-CM

## 2019-11-12 DIAGNOSIS — R05 Cough: Secondary | ICD-10-CM

## 2019-11-12 DIAGNOSIS — R059 Cough, unspecified: Secondary | ICD-10-CM

## 2019-11-12 MED ORDER — BENZONATATE 100 MG PO CAPS: 100.0000 mg | ORAL_CAPSULE | Freq: Three times a day (TID) | ORAL | 0 refills | Status: DC | PRN

## 2019-11-12 MED ORDER — ALBUTEROL SULFATE HFA 108 (90 BASE) MCG/ACT IN AERS: 2.0000 | INHALATION_SPRAY | RESPIRATORY_TRACT | 0 refills | Status: DC | PRN

## 2019-11-12 MED ORDER — PROMETHAZINE-DM 6.25-15 MG/5ML PO SYRP: 5.0000 mL | ORAL_SOLUTION | Freq: Four times a day (QID) | ORAL | 0 refills | Status: DC | PRN

## 2019-11-12 NOTE — Progress Notes (Signed)
Your test for COVID-19 was positive, meaning that you were infected with the novel coronavirus and could give the germ to others.    You have been enrolled in MyChart Home Monitoring for COVID-19. Daily you will receive a questionnaire within the MyChart website. Our COVID-19 response team will be monitoring your responses daily.  Please continue isolation at home, for at least 10 days since the start of your symptoms and until you have had 24 hours with no fever (without taking a fever reducer) and with improving of symptoms.  Please continue good preventive care measures, including:  frequent hand-washing, avoid touching your face, cover coughs/sneezes, stay out of crowds and keep a 6 foot distance from others.  Recheck or go to the nearest hospital ED tent for re-assessment if fever/cough/breathlessness return.   You can use medication such as A prescription cough medication called Tessalon Perles 100 mg. You may take 1-2 capsules every 8 hours as needed for cough, A prescription inhaler called Albuterol MDI 90 mcg /actuation 2 puffs every 4 hours as needed for shortness of breath, wheezing, cough and A prescription cough medication called Phenergan DM 6.25 mg/15 mg. You make take one teaspoon / 5 ml every 4-6 hours as needed for cough  You may also take acetaminophen (Tylenol) as needed for fever.  Reduce your risk of any infection and infecting others by using the same precautions used for avoiding the common cold or flu:  Marland Kitchen Wash your hands often with soap and warm water for at least 20 seconds.  If soap and water are not readily available, use an alcohol-based hand sanitizer with at least 60% alcohol.  . If coughing or sneezing, cover your mouth and nose by coughing or sneezing into the elbow areas of your shirt or coat, into a tissue or into your sleeve (not your hands). . Avoid shaking hands with others and consider head nods or verbal greetings only. . Avoid touching your eyes, nose, or mouth  with unwashed hands.  . Avoid close contact with people who are sick. . Avoid places or events with large numbers of people in one location, like concerts or sporting events. . Carefully consider travel plans you have or are making. . If you are planning any travel outside or inside the [place], visit the CDC's Travelers' Health webpage for the latest health notices. . If you have some symptoms but not all symptoms, continue to monitor at home and seek medical attention if your symptoms worsen. . If you are having a medical emergency, call 911.  HOME CARE . Only take medications as instructed by your medical team. . Drink plenty of fluids and get plenty of rest. . A steam or ultrasonic humidifier can help if you have congestion.   GET HELP RIGHT AWAY IF YOU HAVE EMERGENCY WARNING SIGNS** FOR COVID-19. If you or someone is showing any of these signs seek emergency medical care immediately. Call 911 or proceed to your closest emergency facility if: . You develop worsening high fever. . Trouble breathing . Bluish lips or face . Persistent pain or pressure in the chest . New confusion . Inability to wake or stay awake . You cough up blood. . Your symptoms become more severe  **This list is not all possible symptoms. Contact your medical provider for any symptoms that are sever or concerning to you.  MAKE SURE YOU   Understand these instructions.  Will watch your condition.  Will get help right away if you are not doing  well or get worse.  Your e-visit answers were reviewed by a board certified advanced clinical practitioner to complete your personal care plan.  Depending on the condition, your plan could have included both over the counter or prescription medications.  If there is a problem please reply once you have received a response from your provider.  Your safety is important to [place].  If you have drug allergies check your prescription carefully.    You can use MyChart to ask  questions about today's visit, request a non-urgent call back, or ask for a work or school excuse for 24 hours related to this e-Visit. If it has been greater than 24 hours you will need to follow up with your provider, or enter a new e-Visit to address those concerns. You will get an e-mail in the next two days asking about your experience.  I hope that your e-visit has been valuable and will speed your recovery. Thank you for using e-visits.   Greater than 5 minutes, yet less than 10 minutes of time have been spent researching, coordinating and implementing care for this patient today.

## 2019-11-19 ENCOUNTER — Encounter: Payer: Self-pay | Admitting: Family

## 2019-11-19 ENCOUNTER — Encounter (INDEPENDENT_AMBULATORY_CARE_PROVIDER_SITE_OTHER): Payer: Self-pay

## 2019-12-12 ENCOUNTER — Telehealth: Payer: BC Managed Care – PPO | Admitting: Nurse Practitioner

## 2019-12-12 DIAGNOSIS — R197 Diarrhea, unspecified: Secondary | ICD-10-CM

## 2019-12-12 NOTE — Progress Notes (Signed)
We are sorry that you are not feeling well.  Here is how we plan to help!  Based on what you have shared with me it looks like you have Acute  Diarrhea. I have seen you had an evisit last month for  same diagnosis. If it is not getting better with imodium you will need a face to face visit with your PCP.  I ave writtten you a work note and it is in your my chart.  Most cases of acute diarrhea are due to infections with virus and bacteria and are self-limited conditions lasting less than 14 days.  For your symptoms you may take Imodium 2 mg tablets that are over the counter at your local pharmacy. Take two tablet now and then one after each loose stool up to 6 a day.  Antibiotics are not needed for most people with diarrhea.   HOME CARE  We recommend changing your diet to help with your symptoms for the next few days.  Drink plenty of fluids that contain water salt and sugar. Sports drinks such as Gatorade may help.   You may try broths, soups, bananas, applesauce, soft breads, mashed potatoes or crackers.   You are considered infectious for as long as the diarrhea continues. Hand washing or use of alcohol based hand sanitizers is recommend.  It is best to stay out of work or school until your symptoms stop.   GET HELP RIGHT AWAY  If you have dark yellow colored urine or do not pass urine frequently you should drink more fluids.    If your symptoms worsen   If you feel like you are going to pass out (faint)  You have a new problem  MAKE SURE YOU   Understand these instructions.  Will watch your condition.  Will get help right away if you are not doing well or get worse.  Your e-visit answers were reviewed by a board certified advanced clinical practitioner to complete your personal care plan.  Depending on the condition, your plan could have included both over the counter or prescription medications.  If there is a problem please reply  once you have received a response  from your provider.  Your safety is important to [place].  If you have drug allergies check your prescription carefully.    You can use MyChart to ask questions about today's visit, request a non-urgent call back, or ask for a work or school excuse for 24 hours related to this e-Visit. If it has been greater than 24 hours you will need to follow up with your provider, or enter a new e-Visit to address those concerns.   You will get an e-mail in the next two days asking about your experience.  I hope that your e-visit has been valuable and will speed your recovery. Thank you for using e-visits.  5-10 minutes spent reviewing and documenting in chart.

## 2019-12-15 ENCOUNTER — Telehealth: Payer: Self-pay | Admitting: Physician Assistant

## 2019-12-15 DIAGNOSIS — R112 Nausea with vomiting, unspecified: Secondary | ICD-10-CM

## 2019-12-15 NOTE — Progress Notes (Signed)
Based on what you shared with me, I feel your condition warrants further evaluation and I recommend that you be seen for a face to face office visit.   Your chart is reviewed and 3 days ago you had a complaint of diarrhea and now the complaint of the vomiting and nausea.  I do think you should get medical attention.  You can use an urgent care.  Try to keep fluids down and not get dehydrated.  You can also reach out to your PCP about getting in to see them as soon as possible.   NOTE: If you entered your credit card information for this eVisit, you will not be charged. You may see a "hold" on your card for the $35 but that hold will drop off and you will not have a charge processed.   If you are having a true medical emergency please call 911.      For an urgent face to face visit, [place] has five urgent care centers for your convenience:      NEW:  [facility] at [facility] Directions [phone] [address] [place], [place] [address] . 10 am - 6pm Monday - Friday    [facility]) Get Driving Directions [phone] [address] [place], [place] [address] . 10 am to 8 pm Monday-Friday . 12 pm to 8 pm [facility] Urgent Care at [facility] Get Driving Directions [phone] [address] [place], [place] [address] . 8 am to 8 pm Monday-Friday . 9 am to 6 pm Saturday . 11 am to 6 pm Sunday     [facility] at [facility] Get Driving Directions  [phone] [address] [place], [place] [address] . 8 am to 8 pm Monday-Friday . 8 am to 4 pm [facility] at [facility] Directions [phone] [address] [place], [place] [address] . 12 pm to 6 pm Monday-Friday      Your e-visit answers were reviewed by a board certified advanced clinical practitioner to complete  your personal care plan.  Thank you for using e-Visits.   Prudy Feeler PA-C  Approximately 5 minutes was spent documenting and reviewing patient's chart.

## 2019-12-26 ENCOUNTER — Telehealth: Payer: Self-pay | Admitting: Family

## 2019-12-26 DIAGNOSIS — R197 Diarrhea, unspecified: Secondary | ICD-10-CM

## 2019-12-26 NOTE — Progress Notes (Signed)
We are sorry that you are not feeling well.  Here is how we plan to help!  Based on what you have shared with me it looks like you have Acute Infectious Diarrhea.  Most cases of acute diarrhea are due to infections with virus and bacteria and are self-limited conditions lasting less than 14 days.  For your symptoms you may take Imodium 2 mg tablets that are over the counter at your local pharmacy. Take two tablet now and then one after each loose stool up to 6 a day.  Antibiotics are not needed for most people with diarrhea.  I have also sent a work note to Pharmacologist. Please keep you follow up appointment with you PCP.    HOME CARE  We recommend changing your diet to help with your symptoms for the next few days.  Drink plenty of fluids that contain water salt and sugar. Sports drinks such as Gatorade may help.   You may try broths, soups, bananas, applesauce, soft breads, mashed potatoes or crackers.   You are considered infectious for as long as the diarrhea continues. Hand washing or use of alcohol based hand sanitizers is recommend.  It is best to stay out of work or school until your symptoms stop.   GET HELP RIGHT AWAY  If you have dark yellow colored urine or do not pass urine frequently you should drink more fluids.    If your symptoms worsen   If you feel like you are going to pass out (faint)  You have a new problem  MAKE SURE YOU   Understand these instructions.  Will watch your condition.  Will get help right away if you are not doing well or get worse.  Your e-visit answers were reviewed by a board certified advanced clinical practitioner to complete your personal care plan.  Depending on the condition, your plan could have included both over the counter or prescription medications.  If there is a problem please reply  once you have received a response from your provider.  Your safety is important to [place].  If you have drug allergies check your prescription  carefully.    You can use MyChart to ask questions about today's visit, request a non-urgent call back, or ask for a work or school excuse for 24 hours related to this e-Visit. If it has been greater than 24 hours you will need to follow up with your provider, or enter a new e-Visit to address those concerns.   You will get an e-mail in the next two days asking about your experience.  I hope that your e-visit has been valuable and will speed your recovery. Thank you for using e-visits.   Approximately 5 minutes was spent documenting and reviewing patient's chart.

## 2020-01-10 ENCOUNTER — Other Ambulatory Visit (INDEPENDENT_AMBULATORY_CARE_PROVIDER_SITE_OTHER): Payer: Self-pay | Admitting: Physician Assistant

## 2020-02-09 ENCOUNTER — Telehealth: Payer: Self-pay | Admitting: Nurse Practitioner

## 2020-02-09 DIAGNOSIS — L03119 Cellulitis of unspecified part of limb: Secondary | ICD-10-CM

## 2020-02-09 MED ORDER — CEPHALEXIN 500 MG PO CAPS: 500.0000 mg | ORAL_CAPSULE | Freq: Four times a day (QID) | ORAL | 0 refills | Status: DC

## 2020-02-09 NOTE — Progress Notes (Signed)

## 2020-02-26 ENCOUNTER — Telehealth: Payer: Self-pay | Admitting: Physician Assistant

## 2020-02-26 DIAGNOSIS — L03011 Cellulitis of right finger: Secondary | ICD-10-CM

## 2020-02-26 MED ORDER — CLINDAMYCIN HCL 150 MG PO CAPS: 450.0000 mg | ORAL_CAPSULE | Freq: Three times a day (TID) | ORAL | 0 refills | Status: AC

## 2020-02-26 NOTE — Progress Notes (Signed)
E Visit for Cellulitis  We are sorry that you are not feeling well. Here is how we plan to help!  Based on what you shared with me it looks like you have cellulitis.  Specifically it looks like you have a paronychia which is an infection of your cuticle and finger.  Cellulitis looks like areas of skin redness, swelling, and warmth; it develops as a result of bacteria entering under the skin. Little red spots and/or bleeding can be seen in skin, and tiny surface sacs containing fluid can occur. Fever can be present. Cellulitis is almost always on one side of a body, and the lower limbs are the most common site of involvement.   I have prescribed:  Clindamycin 450 mg take one by mouth three times a day for 5 days   Specifically it will help to soak your finger in warm water several times per day to help open the abscess.  If it does not improve in 24-48 hours you will need a face to face visit to have it opened.    HOME CARE:  . Take your medications as ordered and take all of them, even if the skin irritation appears to be healing.   GET HELP RIGHT AWAY IF:  . Symptoms that don't begin to go away within 48 hours. . Severe redness persists or worsens . If the area turns color, spreads or swells. . If it blisters and opens, develops yellow-brown crust or bleeds. . You develop a fever or chills. . If the pain increases or becomes unbearable.  . Are unable to keep fluids and food down.  MAKE SURE YOU    Understand these instructions.  Will watch your condition.  Will get help right away if you are not doing well or get worse.  Thank you for choosing an e-visit. Your e-visit answers were reviewed by a board certified advanced clinical practitioner to complete your personal care plan. Depending upon the condition, your plan could have included both over the counter or prescription medications. Please review your pharmacy choice. Make sure the pharmacy is open so you can pick up  prescription now. If there is a problem, you may contact your provider through [employer] and have the prescription routed to another pharmacy. Your safety is important to [place]. If you have drug allergies check your prescription carefully.  For the next 24 hours you can use MyChart to ask questions about today's visit, request a non-urgent call back, or ask for a work or school excuse. You will get an email in the next two days asking about your experience. I hope that your e-visit has been valuable and will speed your recovery.  Greater than 5 minutes, yet less than 10 minutes of time have been spent researching, coordinating, and implementing care for this patient today

## 2020-04-25 ENCOUNTER — Other Ambulatory Visit (INDEPENDENT_AMBULATORY_CARE_PROVIDER_SITE_OTHER): Payer: Self-pay | Admitting: Physician Assistant

## 2020-07-28 ENCOUNTER — Ambulatory Visit: Payer: Non-veteran care | Admitting: Urology

## 2020-07-28 ENCOUNTER — Encounter: Payer: Self-pay | Admitting: Urology

## 2020-08-06 ENCOUNTER — Encounter: Payer: Self-pay | Admitting: Urology

## 2020-08-06 ENCOUNTER — Other Ambulatory Visit: Payer: Self-pay

## 2020-08-06 ENCOUNTER — Ambulatory Visit (INDEPENDENT_AMBULATORY_CARE_PROVIDER_SITE_OTHER): Payer: No Typology Code available for payment source | Admitting: Urology

## 2020-08-06 VITALS — BP 136/78 | HR 82 | Ht 71.0 in | Wt 285.1 lb

## 2020-08-06 DIAGNOSIS — N529 Male erectile dysfunction, unspecified: Secondary | ICD-10-CM | POA: Diagnosis not present

## 2020-08-06 MED ORDER — TADALAFIL 5 MG PO TABS: 5.0000 mg | ORAL_TABLET | ORAL | 6 refills | Status: AC

## 2020-08-06 NOTE — Patient Instructions (Signed)
Tadalafil tablets (Cialis) What is this medicine? TADALAFIL (tah DA la fil) is used to treat erection problems in men. It is also used for enlargement of the prostate gland in men, a condition called benign prostatic hyperplasia or BPH. This medicine improves urine flow and reduces BPH symptoms. This medicine can also treat both erection problems and BPH when they occur together. This medicine may be used for other purposes; ask your health care provider or pharmacist if you have questions. COMMON BRAND NAME(S): Adcirca, ALYQ, Cialis What should I tell my health care provider before I take this medicine? They need to know if you have any of these conditions:  bleeding disorders  eye or vision problems, including a rare inherited eye disease called retinitis pigmentosa  anatomical deformation of the penis, Peyronie's disease, or history of priapism (painful and prolonged erection)  heart disease, angina, a history of heart attack, irregular heart beats, or other heart problems  high or low blood pressure  history of blood diseases, like sickle cell anemia or leukemia  history of stomach bleeding  kidney disease  liver disease  stroke  an unusual or allergic reaction to tadalafil, other medicines, foods, dyes, or preservatives  pregnant or trying to get pregnant  breast-feeding How should I use this medicine? Take this medicine by mouth with a glass of water. Follow the directions on the prescription label. You may take this medicine with or without meals. When this medicine is used for erection problems, your doctor may prescribe it to be taken once daily or as needed. If you are taking the medicine as needed, you may be able to have sexual activity 30 minutes after taking it and for up to 36 hours after taking it. Whether you are taking the medicine as needed or once daily, you should not take more than one dose per day. If you are taking this medicine for symptoms of benign  prostatic hyperplasia (BPH) or to treat both BPH and an erection problem, take the dose once daily at about the same time each day. Do not take your medicine more often than directed. Talk to your pediatrician regarding the use of this medicine in children. Special care may be needed. Overdosage: If you think you have taken too much of this medicine contact a poison control center or emergency room at once. NOTE: This medicine is only for you. Do not share this medicine with others. What if I miss a dose? If you are taking this medicine as needed for erection problems, this does not apply. If you miss a dose while taking this medicine once daily for an erection problem, benign prostatic hyperplasia, or both, take it as soon as you remember, but do not take more than one dose per day. What may interact with this medicine? Do not take this medicine with any of the following medications:  nitrates like amyl nitrite, isosorbide dinitrate, isosorbide mononitrate, nitroglycerin  other medicines for erectile dysfunction like avanafil, sildenafil, vardenafil  other tadalafil products (Adcirca)  riociguat This medicine may also interact with the following medications:  certain drugs for high blood pressure  certain drugs for the treatment of HIV infection or AIDS  certain drugs used for fungal or yeast infections, like fluconazole, itraconazole, ketoconazole, and voriconazole  certain drugs used for seizures like carbamazepine, phenytoin, and phenobarbital  grapefruit juice  macrolide antibiotics like clarithromycin, erythromycin, troleandomycin  medicines for prostate problems  rifabutin, rifampin or rifapentine This list may not describe all possible interactions. Give your   health care provider a list of all the medicines, herbs, non-prescription drugs, or dietary supplements you use. Also tell them if you smoke, drink alcohol, or use illegal drugs. Some items may interact with your  medicine. What should I watch for while using this medicine? If you notice any changes in your vision while taking this drug, call your doctor or health care professional as soon as possible. Stop using this medicine and call your health care provider right away if you have a loss of sight in one or both eyes. Contact your doctor or health care professional right away if the erection lasts longer than 4 hours or if it becomes painful. This may be a sign of serious problem and must be treated right away to prevent permanent damage. If you experience symptoms of nausea, dizziness, chest pain or arm pain upon initiation of sexual activity after taking this medicine, you should refrain from further activity and call your doctor or health care professional as soon as possible. Do not drink alcohol to excess (examples, 5 glasses of wine or 5 shots of whiskey) when taking this medicine. When taken in excess, alcohol can increase your chances of getting a headache or getting dizzy, increasing your heart rate or lowering your blood pressure. Using this medicine does not protect you or your partner against HIV infection (the virus that causes AIDS) or other sexually transmitted diseases. What side effects may I notice from receiving this medicine? Side effects that you should report to your doctor or health care professional as soon as possible:  allergic reactions like skin rash, itching or hives, swelling of the face, lips, or tongue  breathing problems  changes in hearing  changes in vision  chest pain  fast, irregular heartbeat  prolonged or painful erection  seizures Side effects that usually do not require medical attention (report to your doctor or health care professional if they continue or are bothersome):  back pain  dizziness  flushing  headache  indigestion  muscle aches  nausea  stuffy or runny nose This list may not describe all possible side effects. Call your doctor  for medical advice about side effects. You may report side effects to FDA at 1-800-FDA-1088. Where should I keep my medicine? Keep out of the reach of children. Store at room temperature between 15 and 30 degrees C (59 and 86 degrees F). Throw away any unused medicine after the expiration date. NOTE: This sheet is a summary. It may not cover all possible information. If you have questions about this medicine, talk to your doctor, pharmacist, or health care provider.  2020 Elsevier/Gold Standard (2014-04-05 13:15:49) Erectile Dysfunction Erectile dysfunction (ED) is the inability to get or keep an erection in order to have sexual intercourse. Erectile dysfunction may include:  Inability to get an erection.  Lack of enough hardness of the erection to allow penetration.  Loss of the erection before sex is finished. What are the causes? This condition may be caused by:  Certain medicines, such as: ? Pain relievers. ? Antihistamines. ? Antidepressants. ? Blood pressure medicines. ? Water pills (diuretics). ? Ulcer medicines. ? Muscle relaxants. ? Drugs.  Excessive drinking.  Psychological causes, such as: ? Anxiety. ? Depression. ? Sadness. ? Exhaustion. ? Performance fear. ? Stress.  Physical causes, such as: ? Artery problems. This may include diabetes, smoking, liver disease, or atherosclerosis. ? High blood pressure. ? Hormonal problems, such as low testosterone. ? Obesity. ? Nerve problems. This may include back or pelvic   injuries, diabetes mellitus, multiple sclerosis, or Parkinson disease. What are the signs or symptoms? Symptoms of this condition include:  Inability to get an erection.  Lack of enough hardness of the erection to allow penetration.  Loss of the erection before sex is finished.  Normal erections at some times, but with frequent unsatisfactory episodes.  Low sexual satisfaction in either partner due to erection problems.  A curved penis  occurring with erection. The curve may cause pain or the penis may be too curved to allow for intercourse.  Never having nighttime erections. How is this diagnosed? This condition is often diagnosed by:  Performing a physical exam to find other diseases or specific problems with the penis.  Asking you detailed questions about the problem.  Performing blood tests to check for diabetes mellitus or to measure hormone levels.  Performing other tests to check for underlying health conditions.  Performing an ultrasound exam to check for scarring.  Performing a test to check blood flow to the penis.  Doing a sleep study at home to measure nighttime erections. How is this treated? This condition may be treated by:  Medicine taken by mouth to help you achieve an erection (oral medicine).  Hormone replacement therapy to replace low testosterone levels.  Medicine that is injected into the penis. Your health care provider may instruct you how to give yourself these injections at home.  Vacuum pump. This is a pump with a ring on it. The pump and ring are placed on the penis and used to create pressure that helps the penis become erect.  Penile implant surgery. In this procedure, you may receive: ? An inflatable implant. This consists of cylinders, a pump, and a reservoir. The cylinders can be inflated with a fluid that helps to create an erection, and they can be deflated after intercourse. ? A semi-rigid implant. This consists of two silicone rubber rods. The rods provide some rigidity. They are also flexible, so the penis can both curve downward in its normal position and become straight for sexual intercourse.  Blood vessel surgery, to improve blood flow to the penis. During this procedure, a blood vessel from a different part of the body is placed into the penis to allow blood to flow around (bypass) damaged or blocked blood vessels.  Lifestyle changes, such as exercising more, losing  weight, and quitting smoking. Follow these instructions at home: Medicines   Take over-the-counter and prescription medicines only as told by your health care provider. Do not increase the dosage without first discussing it with your health care provider.  If you are using self-injections, perform injections as directed by your health care provider. Make sure to avoid any veins that are on the surface of the penis. After giving an injection, apply pressure to the injection site for 5 minutes. General instructions  Exercise regularly, as directed by your health care provider. Work with your health care provider to lose weight, if needed.  Do not use any products that contain nicotine or tobacco, such as cigarettes and e-cigarettes. If you need help quitting, ask your health care provider.  Before using a vacuum pump, read the instructions that come with the pump and discuss any questions with your health care provider.  Keep all follow-up visits as told by your health care provider. This is important. Contact a health care provider if:  You feel nauseous.  You vomit. Get help right away if:  You are taking oral or injectable medicines and you have an   erection that lasts longer than 4 hours. If your health care provider is unavailable, go to the nearest emergency room for evaluation. An erection that lasts much longer than 4 hours can result in permanent damage to your penis.  You have severe pain in your groin or abdomen.  You develop redness or severe swelling of your penis.  You have redness spreading up into your groin or lower abdomen.  You are unable to urinate.  You experience chest pain or a rapid heart beat (palpitations) after taking oral medicines. Summary  Erectile dysfunction (ED) is the inability to get or keep an erection during sexual intercourse. This problem can usually be treated successfully.  This condition is diagnosed based on a physical exam, your  symptoms, and tests to determine the cause. Treatment varies depending on the cause, and may include medicines, hormone therapy, surgery, or vacuum pump.  You may need follow-up visits to make sure that you are using your medicines or devices correctly.  Get help right away if you are taking or injecting medicines and you have an erection that lasts longer than 4 hours. This information is not intended to replace advice given to you by your health care provider. Make sure you discuss any questions you have with your health care provider. Document Revised: 10/28/2017 Document Reviewed: 12/01/2016 Elsevier Patient Education  2020 Elsevier Inc.  

## 2020-08-06 NOTE — Progress Notes (Signed)
08/06/20 12:41 PM   Nathaniel Wilkerson 1986/03/25 [id number]  CC: Erectile dysfunction  HPI: I saw Mr. Nathaniel Wilkerson today for evaluation of erectile dysfunction.  He was sent over from the [place].  He is a 34 year old male with morbid obesity and a BMI of 40 who reports trouble with erections over the last 5 to 10 years since leaving the [place] and being diagnosed with PTSD.  He has never tried any medications for this.  He apparently had an abnormal EKG at some point, followed by an echo that was normal, and his primary at the [place] was not comfortable prescribing ED medications.  He denies any gross hematuria or significant urinary symptoms.  He denies any chest pain.  He is able to climb 2 flights of stairs and exercise without chest pain or shortness of breath.  His primary difficulty with erections is achieving and maintaining an erection sufficient for intercourse.  He also has a number of questions about his kidney function, and was told he had kidney disease by his [place] provider.  The most recent BMP that I am able to review is from November 2020 and shows a normal creatinine of 1.0, with EGFR greater than 60.  He reportedly had a testosterone checked previously that was normal.   PMH: Past Medical History:  Diagnosis Date  . Abnormal EKG 06/30/2016  . HSV-2 infection   . Hypertension   . Numbness and tingling in left arm 06/30/2016  . Occipital headache 06/30/2016    Surgical History: Past Surgical History:  Procedure Laterality Date  . INGUINAL HERNIA REPAIR N/A 05/17/2019   Procedure: HERNIA REPAIR umbilical INCARCERATED;  Surgeon: Benjamine Sprague, DO;  Location: [facility] ORS;  Service: General;  Laterality: N/A;   Family History: Family History  Problem Relation Age of Onset  . Hypertension Mother   . Hypertension Father   . Heart attack Maternal Grandfather   . Hypertension Maternal Grandfather   . Heart attack Paternal Grandfather   . Kidney cancer Paternal Grandfather   . Diabetes Paternal  Grandfather   . Hypertension Maternal Grandmother   . Diabetes Maternal Grandmother   . Hypertension Paternal Grandmother     Social History:  reports that he has quit smoking. He has never used smokeless tobacco. He reports current alcohol use. He reports that he does not use drugs.  Physical Exam: BP 136/78 (BP Location: Left Arm, Patient Position: Sitting, Cuff Size: Large)   Pulse 82   Ht _0  (1.803 m)   Wt 285 lb 1.6 oz (129.3 kg)   BMI 39.76 kg/m    Constitutional:  Alert and oriented, No acute distress. Cardiovascular: No clubbing, cyanosis, or edema. Respiratory: Normal respiratory effort, no increased work of breathing. GI: Abdomen is soft, nontender, nondistended, no abdominal masses GU: Normal-appearing phallus without lesions, patent meatus, testicles 25 cc and descended bilaterally without masses.  Laboratory Data: Reviewed  Pertinent Imaging: None to review  Assessment & Plan:   In summary, is a 34 year old male with erectile dysfunction over the last 5 to 10 years.  We discussed the complex etiology of erectile dysfunction, especially in the setting of his PTSD and obesity.  We discussed behavioral strategies including exercise, and a healthy diet.  I did recommend a trial of PDE 5 inhibitors, and a prescription for Cialis 5 mg every other day was provided.  We discussed the risks and benefits at length.  Regarding his renal function, we reviewed his lab values that I have access to, and discussed  basic strategies including adequate hydration and avoiding NSAIDs.  Finally, we discussed the AUA guidelines regarding erectile dysfunction and consideration of checking testosterone.  With his prior normal value, I recommended just a trial of PDE 5 inhibitors first, but we could certainly recheck a testosterone in the future if he has refractory symptoms.  Trial of Cialis 5 mg every other day Virtual visit 1 month for symptom check  Nickolas Madrid,  MD 08/06/2020  [facility] [address], [place], [place] [address] [phone]

## 2020-09-03 ENCOUNTER — Telehealth: Payer: Self-pay | Admitting: Urology

## 2020-09-04 ENCOUNTER — Encounter: Payer: Self-pay | Admitting: Urology

## 2020-10-27 ENCOUNTER — Telehealth: Payer: Self-pay | Admitting: Physician Assistant

## 2020-10-27 DIAGNOSIS — R300 Dysuria: Secondary | ICD-10-CM

## 2020-10-27 NOTE — Progress Notes (Signed)
Based on what you shared with me, I feel your condition warrants further evaluation and I recommend that you be seen for a face to face office visit.  Male bladder infections are not very common.  We worry about prostate or kidney conditions.  The standard of care is to examine the abdomen and kidneys, and to do a urine and blood test to make sure that something more serious is not going on.  We recommend that you see a provider today.  If your doctor's office is closed [facility] has the following Urgent Cares:    NOTE: If you entered your credit card information for this eVisit, you will not be charged. You may see a "hold" on your card for the $35 but that hold will drop off and you will not have a charge processed.   If you are having a true medical emergency please call 911.     For an urgent face to face visit, [facility] has four urgent care centers for your convenience:    NEW:  [facility] [phone] [address] [place], [place] [address] .  Monday - Friday 10 am - 6 pm    . [facility]    [phone]                  Get Driving Directions  [address] [place], [place] [address] . 10 am to 8 pm Monday-Friday . 12 pm to 8 pm Saturday-Sunday   . [facility]  [phone]                  Get Driving Directions  [address] [place], [place] [address] . 8 am to 8 pm Monday-Friday . 9 am to 6 pm Saturday . 11 am to 6 pm Sunday   . [facility] [place]  [phone]                  Get Driving Directions   [address] [place], [place] [address] . 8 am to 8 pm Monday-Friday . 8 am to 4 pm Saturday-Sunday    .  Urgent Care at [facility]                    Get Driving Directions  [phone]  [address] [place], [place] [address]  . Monday-Friday, 12 PM to 6 PM    Your e-visit  answers were reviewed by a board certified advanced clinical practitioner to complete your personal care plan.  Thank you for using e-Visits.  Greater than 5 minutes, yet less than 10 minutes of time have been spent researching, coordinating, and implementing care for this patient today  

## 2020-12-31 IMAGING — CT CT ABDOMEN AND PELVIS WITH CONTRAST
1 of 2 series · 14 of 32 positions shown, 18 images · IV contrast (omnipaque)
Comparison: None.

CLINICAL DATA: Status post umbilical surgery with periumbilical
inflammatory change

EXAM:
CT ABDOMEN AND PELVIS WITH CONTRAST
TECHNIQUE: Multidetector CT imaging of the abdomen and pelvis was performed
using the standard protocol following bolus administration of
intravenous contrast.
CONTRAST:  100mL OMNIPAQUE IOHEXOL 300 MG/ML  SOLN

[Series 3: axial st · axial · 0.98mm/px · z∈[-889,-404]mm · 14 of 107 slices shown, 18 images]
[im 5/107  soft-tissue]
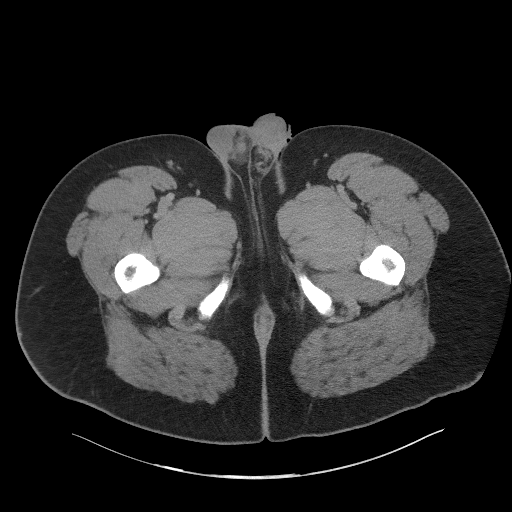
[im 5/107  bone]
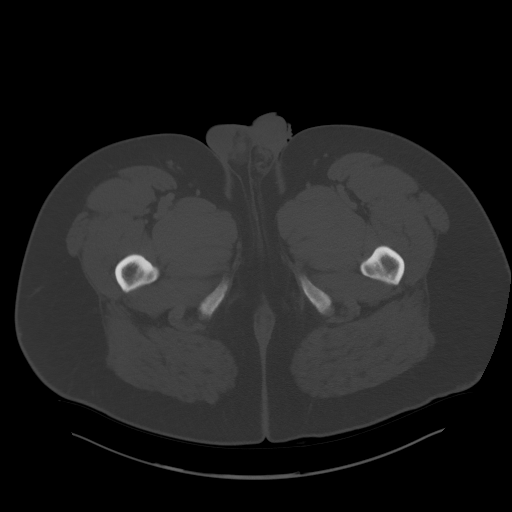
[im 14/107  soft-tissue]
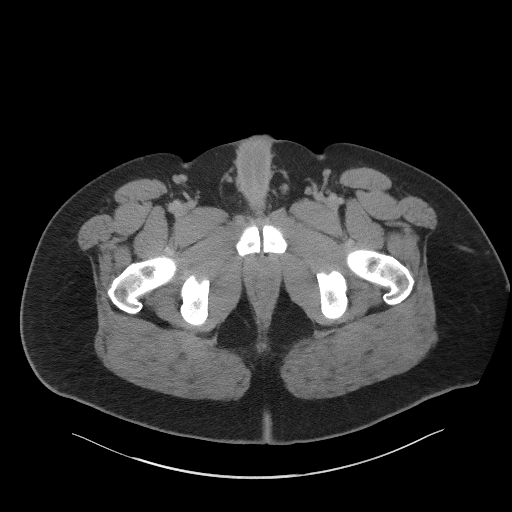
[im 23/107  soft-tissue]
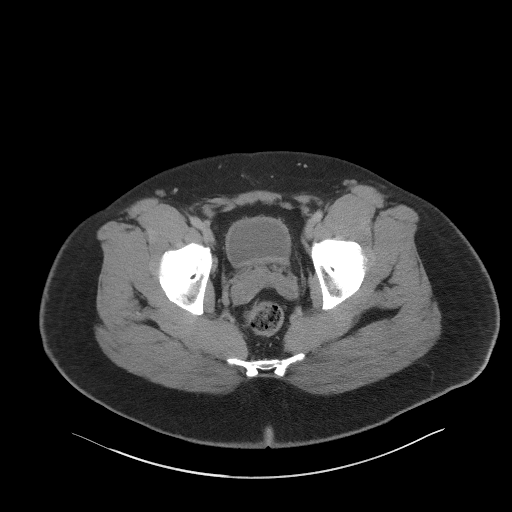
[im 31/107  soft-tissue]
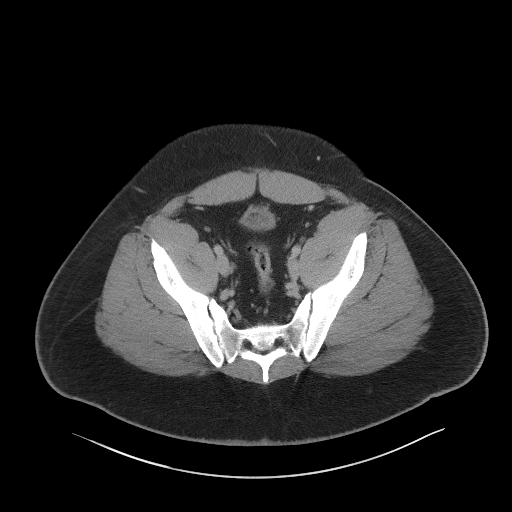
[im 40/107  soft-tissue]
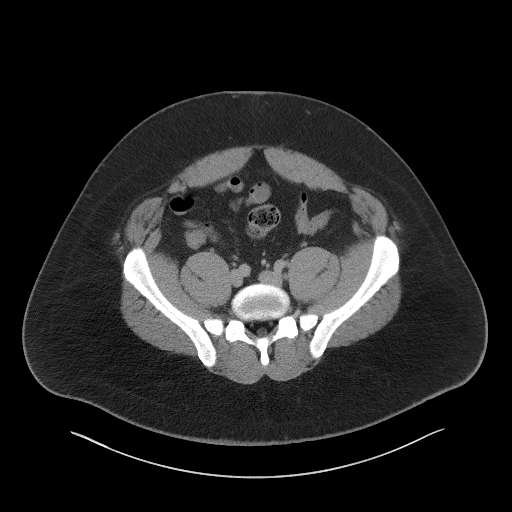
[im 49/107  soft-tissue]
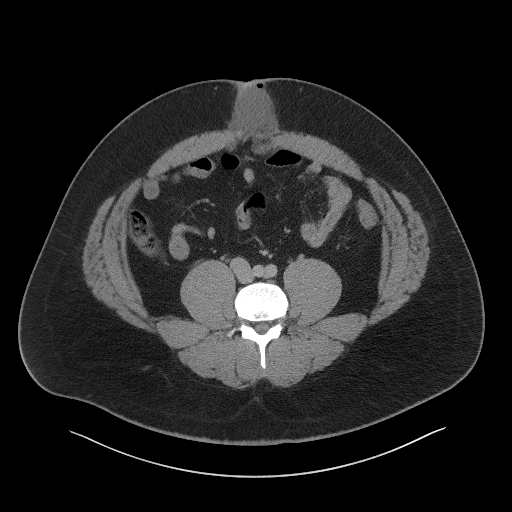
[im 58/107  soft-tissue]
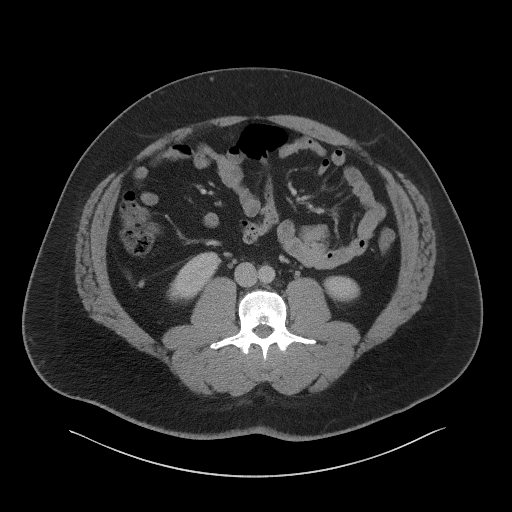
[im 67/107  soft-tissue]
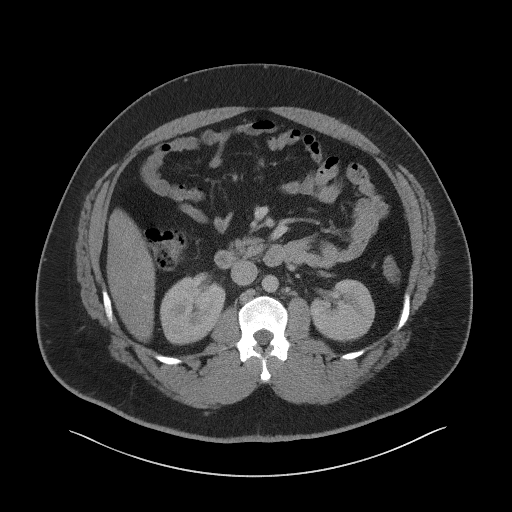
[im 76/107  soft-tissue]
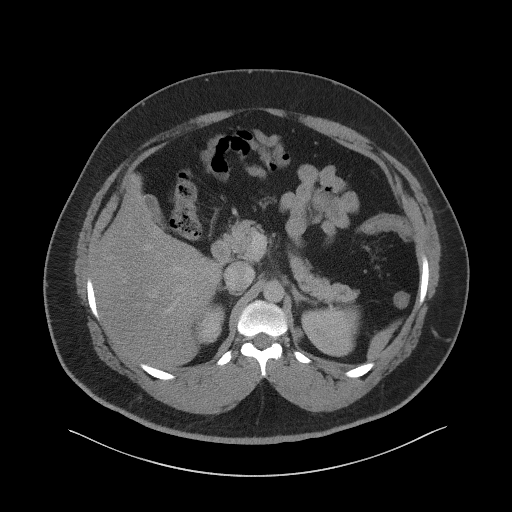
[im 76/107  bone]
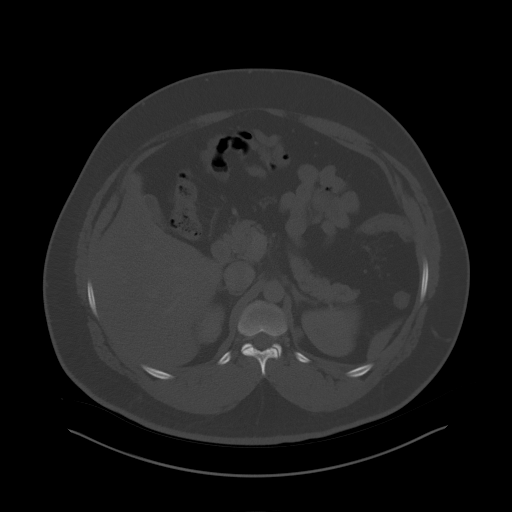
[im 84/107  soft-tissue]
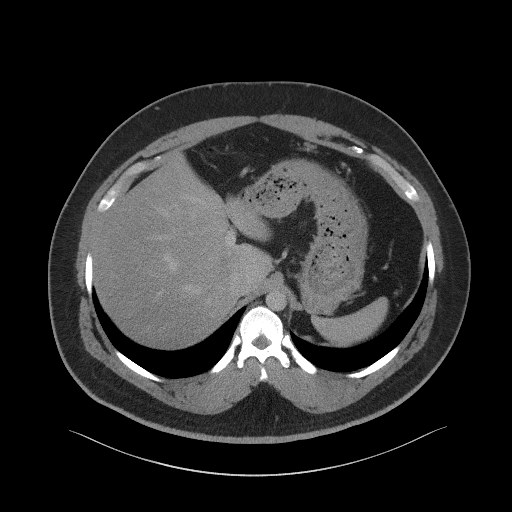
[im 89/107  lung]
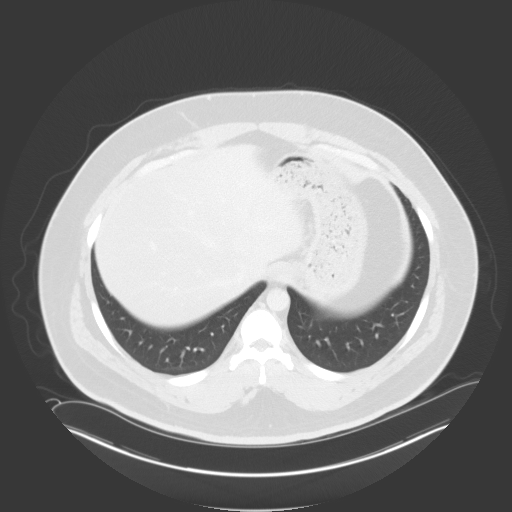
[im 93/107  soft-tissue]
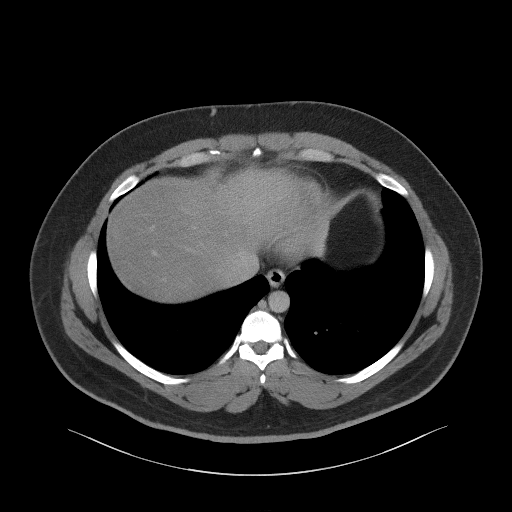
[im 93/107  lung]
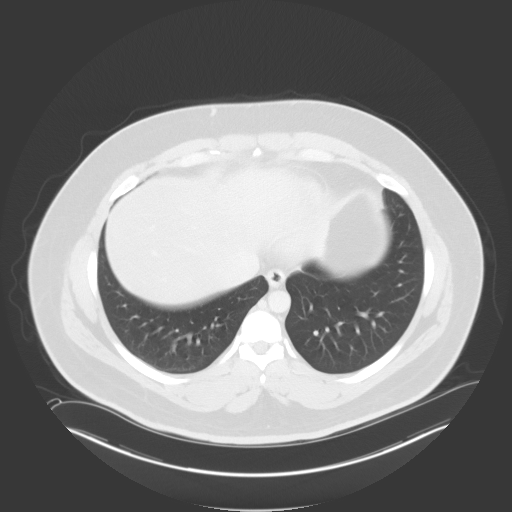
[im 98/107  lung]
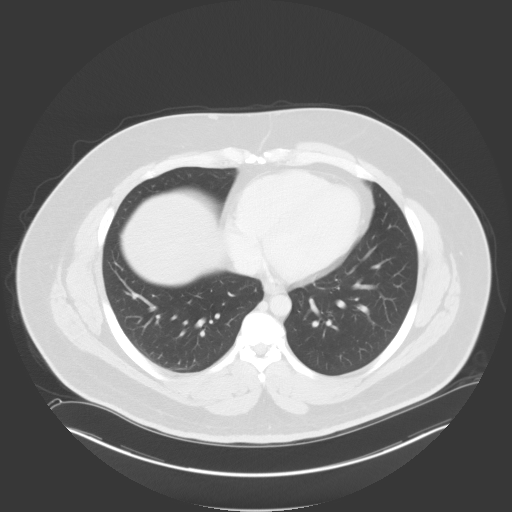
[im 102/107  soft-tissue]
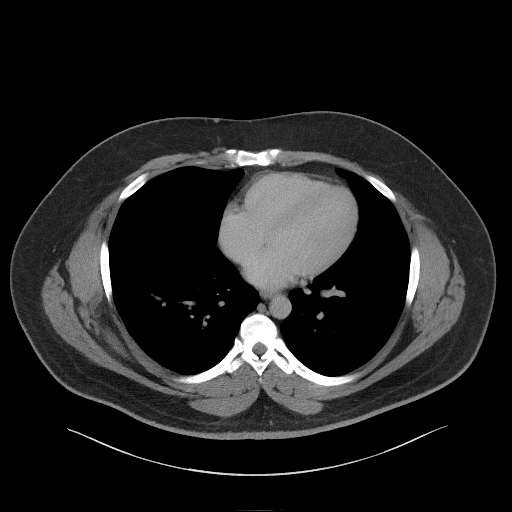
[im 102/107  lung]
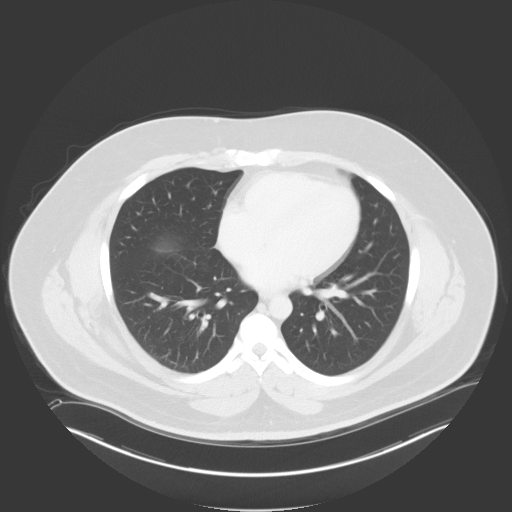

[14 of 32 positions shown; findings below may reference images not displayed]

FINDINGS: Lower chest: Lung bases are clear.

Hepatobiliary: There is hepatic steatosis. No focal liver lesions
are evident. Gallbladder wall is not appreciably thickened. The
gallbladder is mildly contracted. There is no biliary duct
dilatation.

Pancreas: No pancreatic mass or inflammatory focus.

Spleen: No splenic lesions are evident.

Adrenals/Urinary Tract: Adrenals bilaterally appear normal. Kidneys
bilaterally show no evident mass or hydronephrosis on either side.
There is no evident renal or ureteral calculus on either side.
Urinary bladder is midline with wall thickness within normal limits
for degree of distention.

Stomach/Bowel: There is no appreciable bowel wall or mesenteric
thickening. Terminal ileum appears normal. No evident bowel
obstruction. No free air or portal venous air evident.

Vascular/Lymphatic: No abdominal aortic aneurysm appreciable. No
vascular lesions are evident. No adenopathy is evident in the
abdomen or pelvis.

Reproductive: Prostate and seminal vesicles are normal in size and
contour. There is no evident pelvic mass.

Other: Appendix appears normal. There is no abscess or ascites
within the peritoneum or retroperitoneum.

There is inflammation in the periumbilical region consistent with
recent surgery. There is an air-containing fluid collection arising
between the umbilicus in the rectus muscles in the midline measuring
5.7 x 6.1 x 4.7 cm. This appearance is consistent with an abscess
arising in the abdominal wall at the level of the umbilicus. No
similar abdominal wall lesions elsewhere.

There is fat in each inguinal ring.

Musculoskeletal: No blastic or lytic bone lesions are evident. No
intramuscular lesions are evident.
IMPRESSION: 1. Postoperative change in the umbilical region. There is an abscess
in the midline anterior abdominal wall extending from the umbilicus
to the level of the midline rectus muscles. This abscess does not
extend into the peritoneum, although there is postoperative
inflammatory type change in the peritoneum slightly deep to the
rectus muscles in the midline at the umbilical level. This abscess
measures 5.7 x 6.1 x 4.7 cm.

2. No abscess within the peritoneum or retroperitoneum of the
abdomen and pelvis. No bowel obstruction. Appendix appears normal.

3.  No evident renal or ureteral calculus.  No hydronephrosis.

4.  Hepatic steatosis.

These results will be called to the ordering clinician or
representative by the Radiologist Assistant, and communication
documented in the PACS or zVision Dashboard.
